# Patient Record
Sex: Male | Born: 2000 | Hispanic: No | Marital: Single | Smoking: Current every day smoker
Health system: Southern US, Community
[De-identification: ages and names within clinical notes are randomized; demographics above are authoritative.]

## PROBLEM LIST (undated history)

## (undated) DIAGNOSIS — F919 Conduct disorder, unspecified: Secondary | ICD-10-CM

## (undated) DIAGNOSIS — F1210 Cannabis abuse, uncomplicated: Secondary | ICD-10-CM

## (undated) DIAGNOSIS — Z639 Problem related to primary support group, unspecified: Secondary | ICD-10-CM

## (undated) DIAGNOSIS — F419 Anxiety disorder, unspecified: Secondary | ICD-10-CM

## (undated) DIAGNOSIS — E663 Overweight: Secondary | ICD-10-CM

## (undated) DIAGNOSIS — J309 Allergic rhinitis, unspecified: Secondary | ICD-10-CM

## (undated) DIAGNOSIS — H1013 Acute atopic conjunctivitis, bilateral: Secondary | ICD-10-CM

## (undated) HISTORY — DX: Overweight: E66.3

## (undated) HISTORY — DX: Conduct disorder, unspecified: F91.9

## (undated) HISTORY — DX: Anxiety disorder, unspecified: F41.9

## (undated) HISTORY — DX: Cannabis abuse, uncomplicated: F12.10

## (undated) HISTORY — DX: Acute atopic conjunctivitis, bilateral: H10.13

## (undated) HISTORY — DX: Problem related to primary support group, unspecified: Z63.9

## (undated) HISTORY — DX: Allergic rhinitis, unspecified: J30.9

---
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
## 2020-03-23 ENCOUNTER — Encounter (HOSPITAL_COMMUNITY): Payer: Self-pay | Admitting: Emergency Medicine

## 2020-03-23 ENCOUNTER — Emergency Department (HOSPITAL_COMMUNITY)
Admission: EM | Admit: 2020-03-23 | Discharge: 2020-03-25 | Disposition: A | Discharge status: Other Acute Inpatient Hospital | Payer: Medicaid Other | Attending: Emergency Medicine | Admitting: Emergency Medicine

## 2020-03-23 ENCOUNTER — Other Ambulatory Visit: Payer: Self-pay

## 2020-03-23 DIAGNOSIS — R45851 Suicidal ideations: Secondary | ICD-10-CM | POA: Diagnosis not present

## 2020-03-23 DIAGNOSIS — F17290 Nicotine dependence, other tobacco product, uncomplicated: Secondary | ICD-10-CM | POA: Diagnosis not present

## 2020-03-23 DIAGNOSIS — F329 Major depressive disorder, single episode, unspecified: Secondary | ICD-10-CM | POA: Diagnosis present

## 2020-03-23 DIAGNOSIS — F12259 Cannabis dependence with psychotic disorder, unspecified: Secondary | ICD-10-CM | POA: Insufficient documentation

## 2020-03-23 DIAGNOSIS — F323 Major depressive disorder, single episode, severe with psychotic features: Secondary | ICD-10-CM | POA: Insufficient documentation

## 2020-03-23 DIAGNOSIS — Z20822 Contact with and (suspected) exposure to covid-19: Secondary | ICD-10-CM | POA: Insufficient documentation

## 2020-03-23 LAB — COMPREHENSIVE METABOLIC PANEL
ALT: 18 U/L (ref 0–44)
AST: 19 U/L (ref 15–41)
Albumin: 4.9 g/dL (ref 3.5–5.0)
Alkaline Phosphatase: 67 U/L (ref 38–126)
Anion gap: 13 (ref 5–15)
BUN: 12 mg/dL (ref 6–20)
CO2: 21 mmol/L — ABNORMAL LOW (ref 22–32)
Calcium: 9.6 mg/dL (ref 8.9–10.3)
Chloride: 106 mmol/L (ref 98–111)
Creatinine, Ser: 0.89 mg/dL (ref 0.61–1.24)
GFR calc Af Amer: >60 mL/min (ref >60)
GFR calc non Af Amer: >60 mL/min (ref >60)
Glucose, Bld: 111 mg/dL — ABNORMAL HIGH (ref 70–99)
Potassium: 3.4 mmol/L — ABNORMAL LOW (ref 3.5–5.1)
Sodium: 140 mmol/L (ref 135–145)
Total Bilirubin: 1.5 mg/dL — ABNORMAL HIGH (ref 0.3–1.2)
Total Protein: 8.3 g/dL — ABNORMAL HIGH (ref 6.5–8.1)

## 2020-03-23 LAB — ETHANOL: Alcohol, Ethyl (B): <10 mg/dL (ref <10)

## 2020-03-23 LAB — CBC
HCT: 48.9 % (ref 39.0–52.0)
Hemoglobin: 16.4 g/dL (ref 13.0–17.0)
MCH: 29.5 pg (ref 26.0–34.0)
MCHC: 33.5 g/dL (ref 30.0–36.0)
MCV: 88.1 fL (ref 80.0–100.0)
Platelets: 273 10*3/uL (ref 150–400)
RBC: 5.55 MIL/uL (ref 4.22–5.81)
RDW: 12.5 % (ref 11.5–15.5)
WBC: 8.8 10*3/uL (ref 4.0–10.5)
nRBC: 0 % (ref 0.0–0.2)

## 2020-03-23 LAB — RAPID URINE DRUG SCREEN, HOSP PERFORMED
Amphetamines: NOT DETECTED
Barbiturates: NOT DETECTED
Benzodiazepines: NOT DETECTED
Cocaine: NOT DETECTED
Opiates: NOT DETECTED
Tetrahydrocannabinol: POSITIVE — AB

## 2020-03-23 LAB — SARS CORONAVIRUS 2 BY RT PCR (HOSPITAL ORDER, PERFORMED IN ~~LOC~~ HOSPITAL LAB): SARS Coronavirus 2: NEGATIVE

## 2020-03-23 LAB — ACETAMINOPHEN LEVEL: Acetaminophen (Tylenol), Serum: <10 ug/mL — ABNORMAL LOW (ref 10–30)

## 2020-03-23 LAB — SALICYLATE LEVEL: Salicylate Lvl: <7 mg/dL — ABNORMAL LOW (ref 7.0–30.0)

## 2020-03-23 MED ORDER — ACETAMINOPHEN 325 MG PO TABS: 650.0000 mg | ORAL_TABLET | ORAL | Status: DC | PRN

## 2020-03-23 MED ORDER — ZOLPIDEM TARTRATE 5 MG PO TABS
5.0000 mg | ORAL_TABLET | Freq: Every evening | ORAL | Status: DC | PRN
  Administered 2020-03-24 (×2): 5 mg via ORAL
  Filled 2020-03-23 (×2): qty 1

## 2020-03-23 NOTE — ED Provider Notes (Signed)
[facility] EMERGENCY DEPARTMENT Provider Note   CSN: [id number] Arrival date & time: 03/23/20  1833     History Chief Complaint  Patient presents with  . Suicidal    Dwayne Gallagher is a 19 y.o. male.  Patient is a 19 year old male who has a history of anxiety and conduct disorder.  He came in with his dad.  He states that he has had worsening depression in the recent past because he says his mom has been cheating on his dad.  This is causing him a lot of stress and he is having thoughts that life is not worth living.  He reported to one of our techs that he was having thoughts of slitting his wrist with a knife.  He denies any ingestions.  He smokes marijuana but denies any other drug use.  He denies any alcohol use.  He does have a prior history of suicide attempts.  He denies any physical complaints or recent illnesses other than he has a little spot of a rash on his left arm.  He says is not itchy.        Past Medical History:  Diagnosis Date  . Allergic conjunctivitis of both eyes and rhinitis 05/09/2014  . Anxiety   . Conduct disorder 09/29/2014  . Family problems 05/09/2014  . 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 Overweight     Patient Active Problem List   Diagnosis Date Noted  . Vitamin D insufficiency 08/30/2016  . Acne vulgaris 08/30/2016  . Conduct disorder 09/29/2014  . Marijuana abuse 09/29/2014    History reviewed. No pertinent surgical history.     Family History  Problem Relation Age of Onset  . Asthma Brother   . Osteoporosis Maternal Grandmother   . Hyperlipidemia Maternal Grandmother     Social History   Tobacco Use  . Smoking status: Current Every Day Smoker    Types: E-cigarettes  . Smokeless tobacco: Never Used  Substance Use Topics  . Alcohol use: Yes  . Drug use: Yes    Types: Marijuana    Home Medications Prior to Admission medications   Medication Sig Start Date End Date Taking? Authorizing Provider    adapalene (DIFFERIN) 0.1 % cream Apply topically at bedtime. 09/23/19   Ettefagh, Aron Baba, MD  clindamycin-benzoyl peroxide [facility]) gel Apply topically daily. 09/23/19   Ettefagh, Aron Baba, MD    Allergies    Patient has no known allergies.  Review of Systems   Review of Systems  Constitutional: Negative for chills, diaphoresis, fatigue and fever.  HENT: Negative for congestion, rhinorrhea and sneezing.   Eyes: Negative.   Respiratory: Negative for cough, chest tightness and shortness of breath.   Cardiovascular: Negative for chest pain and leg swelling.  Gastrointestinal: Negative for abdominal pain, blood in stool, diarrhea, nausea and vomiting.  Genitourinary: Negative for difficulty urinating, flank pain, frequency and hematuria.  Musculoskeletal: Negative for arthralgias and back pain.  Skin: Positive for rash.  Neurological: Negative for dizziness, speech difficulty, weakness, numbness and headaches.  Psychiatric/Behavioral: Positive for suicidal ideas.    Physical Exam Updated Vital Signs BP (!) 142/74 (BP Location: Right Arm)   Pulse 60   Temp 99.1 F (37.3 C) (Oral)   Resp 16   Ht 6\' 1"  (1.854 m)   Wt 70 kg   SpO2 100%   BMI 20.36 kg/m   Physical Exam Constitutional:      Appearance: He is well-developed.  HENT:     Head: Normocephalic and  atraumatic.  Eyes:     Pupils: Pupils are equal, round, and reactive to light.  Cardiovascular:     Rate and Rhythm: Normal rate and regular rhythm.     Heart sounds: Normal heart sounds.  Pulmonary:     Effort: Pulmonary effort is normal. No respiratory distress.     Breath sounds: Normal breath sounds. No wheezing or rales.  Chest:     Chest wall: No tenderness.  Abdominal:     General: Bowel sounds are normal.     Palpations: Abdomen is soft.     Tenderness: There is no abdominal tenderness. There is no guarding or rebound.  Musculoskeletal:        General: Normal range of motion.     Cervical back:  Normal range of motion and neck supple.  Lymphadenopathy:     Cervical: No cervical adenopathy.  Skin:    General: Skin is warm and dry.     Findings: No rash.     Comments: Patient has a small slightly raised erythematous rash in the flexor crease of his left elbow.  There is no open wounds.  No vesicles.  No petechiae or purpura.  Neurological:     Mental Status: He is alert and oriented to person, place, and time.     ED Results / Procedures / Treatments   Labs (all labs ordered are listed, but only abnormal results are displayed) Labs Reviewed  COMPREHENSIVE METABOLIC PANEL - Abnormal; Notable for the following components:      Result Value   Potassium 3.4 (*)    CO2 21 (*)    Glucose, Bld 111 (*)    Total Protein 8.3 (*)    Total Bilirubin 1.5 (*)    All other components within normal limits  SALICYLATE LEVEL - Abnormal; Notable for the following components:   Salicylate Lvl <7.0 (*)    All other components within normal limits  ACETAMINOPHEN LEVEL - Abnormal; Notable for the following components:   Acetaminophen (Tylenol), Serum <10 (*)    All other components within normal limits  RAPID URINE DRUG SCREEN, HOSP PERFORMED - Abnormal; Notable for the following components:   Tetrahydrocannabinol POSITIVE (*)    All other components within normal limits  ETHANOL  CBC    EKG EKG Interpretation  Date/Time:  Thursday Mar 23 2020 21:37:58 EDT Ventricular Rate:  60 PR Interval:    QRS Duration: 120 QT Interval:  402 QTC Calculation: 402 R Axis:   -90 Text Interpretation: Sinus rhythm Short PR interval Left anterior fascicular block ST elev, probable normal early repol pattern Baseline wander in lead(s) V3 since last tracing no significant change Confirmed by Rolan Bucco [phone]) on 03/23/2020 9:46:18 PM   Radiology No results found.  Procedures Procedures (including critical care time)  Medications Ordered in ED Medications - No data to display  ED Course  I  have reviewed the triage vital signs and the nursing notes.  Pertinent labs & imaging results that were available during my care of the patient were reviewed by me and considered in my medical decision making (see chart for details).    MDM Rules/Calculators/A&P                      Patient presents with suicidal ideations.  He initially had attempted to leave and I initiated IVC papers.  He is medically cleared and awaiting TTS evaluation. Final Clinical Impression(s) / ED Diagnoses Final diagnoses:  Suicidal ideation  Rx / DC Orders ED Discharge Orders    None       Malvin Johns, MD 03/23/20 2146

## 2020-03-23 NOTE — ED Notes (Signed)
Dr. Fredderick Phenix ( EDP) notified that patient attempted to leave the ER , EDP will speak with patient at triage .

## 2020-03-23 NOTE — ED Triage Notes (Signed)
Patient reports suicidal ideation - plans to cut his wrist with visual hallucinations , depression and anxiety .

## 2020-03-23 NOTE — ED Provider Notes (Signed)
MSE was initiated and I personally evaluated the patient and placed orders (if any) at  9:07 PM on Mar 23, 2020.  The patient appears stable so that the remainder of the MSE may be completed by another provider.  Patient has a history of anxiety and has had worsening depression.  Is having thoughts of wanting to kill himself.  Was trying to leave the ED.  I talk with him and he is calmer now.  IVC papers were filled out.  He is waiting for an ED bed.   Rolan Bucco, MD 03/23/20 2108

## 2020-03-24 NOTE — ED Notes (Signed)
Pt to purple zone with [employer] and [employer]. Pt trying to run out the doors. Mother walked out to the lobby. Mothers name- Danie Chandler [phone]. Pts iphone given to mother by security to take home. No belongings here. Pt IVC'd. Pt in purple scrubs. Does not want his mother to leave.

## 2020-03-24 NOTE — Progress Notes (Signed)
Pt meets inpatient criteria. Referral information has been sent to the following hospitals for review:  [facility] Details [facility] Details [facility] Details [facility] Details [facility] Details [facility] Details  [facility] Details  [facility] Details [facility] Details [facility]  Disposition will continue to follow.   Wells Guiles, LCSW, LCAS Disposition CSW [facility] BHH/TTS [phone]

## 2020-03-24 NOTE — ED Notes (Signed)
Patient denies pain and is resting comfortably.  

## 2020-03-24 NOTE — ED Notes (Signed)
Dinner tray ordered.

## 2020-03-24 NOTE — ED Notes (Signed)
Patient asked to call his mother to update her on his status; pt denies any needs at this time and is calm and coroporative with staff-Monique,RN

## 2020-03-24 NOTE — ED Notes (Signed)
Unknown staff member seen by sitter escorting patient's father to room while RN dealing with another patient; RN asked for parent to leave and was explained to that there are no visitors allowed in this unit due to Covid; He was also made aware that per off going RN patient did not want to speak with parents at this time; Father was advised of HIPPA laws and the reasoning he nor his wife has not received update is due to patient's request. Father voiced understanding and was escorted out of unit by [employer]-Monique,RN

## 2020-03-24 NOTE — ED Notes (Signed)
Lunch ordered 

## 2020-03-24 NOTE — ED Notes (Signed)
Pt talking on phone to mom.

## 2020-03-24 NOTE — ED Notes (Signed)
Patient had an emotional outburst saying "It's all making sense" pt spoke of getting revenge; Pt is tearful at the [facility]

## 2020-03-24 NOTE — ED Notes (Signed)
Pt mother calling requesting an update on pt. This nurse asked pt if he wanted nursing staff to talk to his mother. Pt states he does not want this nurse talking to his mother. This nurse will not communicate with mother per pt request.

## 2020-03-24 NOTE — ED Notes (Signed)
Pt requests something to help him sleep. Pt states that we are going to talk him away and "do something with him". Assured pt that he is safe and that we are not going to "do anything with him". Pt thinks that he is being punished for doing something wrong by being here. Assured pt that he has done nothing wrong and that we are here to help him. Told pt that he needs to tell the counselors the truth if he is hearing voices so he can get help. States "I just over think things when I smoke and don't know what is real". "I think you guys are just going to take me away and do something with me". "I want to see my mom". "just tell me the truth". "When can I leave?". "Can I have a hug?" Pt tearful at times. States that "this place is very strange. This RN stated that I understanded how he felt about it being strange, but we are only going to try to help him.

## 2020-03-24 NOTE — BHH Counselor (Signed)
Patient accepted to [facility] (main campus) for admission 03/25/2020 (anytime after 8am). The accepting provider is Dr. Estill Cotta.  Nursing report [phone].

## 2020-03-24 NOTE — ED Provider Notes (Signed)
10:39 AM Pt holding in Rm 50. Currently IVC for SI. Awaiting TTS eval. Labs reviewed. No acute medical needs identified.   BP (P) 134/68 (BP Location: Right Arm)   Pulse (P) 76   Temp (P) 98.5 F (36.9 C) (Oral)   Resp (P) 18   Ht 6\' 1"  (1.854 m)   Wt 70 kg   SpO2 (P) 100%   BMI 20.36 kg/m     , PA-C 03/24/20 1039    Tegeler, 03/26/20, MD 03/24/20 [phone]

## 2020-03-24 NOTE — BH Assessment (Addendum)
Assessment Note  Dwayne Gallagher is an 19 y.o. male presenting voluntarily to [facility] ED with a chief complaint of depression and suicidal ideation. Patient has since been placed under IVC by EDP after attempting to elope. Patient appears psychotic- he is staring into corners of the room, thought blocking, and struggles to give appropriate answers to questions at times. Patient reports marital problems between his parents are a trigger for his stress. To cope he recently began using [facility] which has caused him to "over-think." Patient currently denies SI/HI/AVH. Patient reports THC use but is unable to provide specifics about his use. He denies using any other substances. Patient denies any trauma history or current charges. Patient gives verbal consent for TTS to contact his mother, [name].  Collateral information obtained from [name] [phone]: Patient recently began to express to her suicidal ideation. She states issues in her marriage with his father are a trigger for him. On 5/12 she reports she found patient attempted to cut his arms and he states he was "trying to cut a vein." She also states patient is paranoid that someone is going to kill him.   Patient is alert and oriented x 4. He is dressed in scrubs, laying in bed. His speech is slow, eye contact is fair, and he appears to be thought blocking. His mood is anxious and his affect is congruent. He has limited insight, judgement, and impulse control. Patient appears to be responding to internal stimuli and appears delusional.  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 Allergic conjunctivitis of both eyes and rhinitis 05/09/2014  . Anxiety   . Conduct disorder 09/29/2014  . Family problems 05/09/2014  . 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 Overweight     History  reviewed. No pertinent surgical history.  Family History:  Family History  Problem Relation Age of Onset  . Asthma Brother   . Osteoporosis Maternal Grandmother   . Hyperlipidemia Maternal Grandmother     Social History:  reports that he has been smoking e-cigarettes. He has never used smokeless tobacco. He reports current alcohol use. He reports current drug use. Drug: Marijuana.  Additional Social History:  Alcohol / Drug Use Pain Medications: see MAR Prescriptions: see MAR Over the Counter: see MAR History of alcohol / drug use?: No history of alcohol / drug abuse  CIWA: CIWA-Ar BP: (!) 142/75 Pulse Rate: 98 COWS:    Allergies: No Known Allergies  Home Medications: (Not in a hospital admission)   OB/GYN Status:  No LMP for male patient.  General Assessment Data Location of Assessment: [facility] ED TTS Assessment: In system Is this a Tele or Face-to-Face Assessment?: Face-to-Face Is this an Initial Assessment or a Re-assessment for this encounter?: Initial Assessment Patient Accompanied by:: N/A Language Other than English: No Living Arrangements: (private residence) What gender do you identify as?: Male Marital status: Single Pregnancy Status: No Living Arrangements: Parent, Other relatives Can pt return to current living arrangement?: Yes Admission Status: Involuntary Petitioner: ED Attending Is patient capable of signing voluntary admission?: No Referral Source: Self/Family/Friend Insurance type: Medicaid     Crisis Care Plan Living Arrangements: Parent, Other relatives Legal Guardian: (self) Name of Psychiatrist: none Name of Therapist: none  Education Status Is patient currently in school?: No Is the patient employed, unemployed or receiving disability?: Employed  Risk to self with  the past 6 months Suicidal Ideation: Yes-Currently Present Has patient been a risk to self within the past 6 months prior to admission? : Yes Suicidal Intent: No-Not  Currently/Within Last 6 Months Has patient had any suicidal intent within the past 6 months prior to admission? : Yes Is patient at risk for suicide?: Yes Suicidal Plan?: No-Not Currently/Within Last 6 Months Has patient had any suicidal plan within the past 6 months prior to admission? : Yes Access to Means: Yes Specify Access to Suicidal Means: access to knives What has been your use of drugs/alcohol within the last 12 months?: THC use Previous Attempts/Gestures: No How many times?: 0 Other Self Harm Risks: denies Triggers for Past Attempts: None known Intentional Self Injurious Behavior: Cutting Comment - Self Injurious Behavior: mother caught him attempting to cut veins on 5/12 Family Suicide History: No Recent stressful life event(s): Divorce Persecutory voices/beliefs?: No Depression: Yes Depression Symptoms: Despondent, Insomnia, Tearfulness, Isolating, Fatigue, Guilt, Loss of interest in usual pleasures, Feeling worthless/self pity, Feeling angry/irritable Substance abuse history and/or treatment for substance abuse?: No Suicide prevention information given to non-admitted patients: Not applicable  Risk to Others within the past 6 months Homicidal Ideation: No Does patient have any lifetime risk of violence toward others beyond the six months prior to admission? : No Thoughts of Harm to Others: No Current Homicidal Intent: No Current Homicidal Plan: No Access to Homicidal Means: No Identified Victim: NA History of harm to others?: No Assessment of Violence: None Noted Violent Behavior Description: NA Does patient have access to weapons?: No Criminal Charges Pending?: No Does patient have a court date: No Is patient on probation?: No  Psychosis Hallucinations: None noted Delusions: Persecutory  Mental Status Report Appearance/Hygiene: In scrubs Eye Contact: Poor Motor Activity: Freedom of movement Speech: Slow Level of Consciousness: Quiet/awake Mood:  Anxious Affect: Anxious Anxiety Level: Moderate Thought Processes: Circumstantial, Thought Blocking Judgement: Impaired Orientation: Person, Place, Time, Situation Obsessive Compulsive Thoughts/Behaviors: None  Cognitive Functioning Concentration: Fair Memory: Recent Intact, Remote Intact Is patient IDD: No Insight: Fair Impulse Control: Fair Appetite: Good Have you had any weight changes? : No Change Sleep: No Change Total Hours of Sleep: (7) Vegetative Symptoms: None  ADLScreening [facility] Assessment Services) Patient's cognitive ability adequate to safely complete daily activities?: Yes Patient able to express need for assistance with ADLs?: Yes Independently performs ADLs?: Yes (appropriate for developmental age)  Prior Inpatient Therapy Prior Inpatient Therapy: No  Prior Outpatient Therapy Prior Outpatient Therapy: No Does patient have an ACCT team?: No Does patient have Intensive In-House Services?  : No Does patient have [facility] services? : No Does patient have P4CC services?: No  ADL Screening (condition at time of admission) Patient's cognitive ability adequate to safely complete daily activities?: Yes Is the patient deaf or have difficulty hearing?: No Does the patient have difficulty seeing, even when wearing glasses/contacts?: No Does the patient have difficulty concentrating, remembering, or making decisions?: No Patient able to express need for assistance with ADLs?: Yes Does the patient have difficulty dressing or bathing?: No Independently performs ADLs?: Yes (appropriate for developmental age) Does the patient have difficulty walking or climbing stairs?: No Weakness of Legs: None Weakness of Arms/Hands: None  Home Assistive Devices/Equipment Home Assistive Devices/Equipment: None  Therapy Consults (therapy consults require a physician order) PT Evaluation Needed: No OT Evalulation Needed: No SLP Evaluation Needed: No Abuse/Neglect Assessment  (Assessment to be complete while patient is alone) Abuse/Neglect Assessment Can Be Completed: Yes Physical Abuse: Denies Verbal Abuse:  Denies Sexual Abuse: Denies Exploitation of patient/patient's resources: Denies Self-Neglect: Denies Values / Beliefs Cultural Requests During Hospitalization: None Spiritual Requests During Hospitalization: None Consults Spiritual Care Consult Needed: No Transition of Care Team Consult Needed: No Advance Directives (For Healthcare) Does Patient Have a Medical Advance Directive?: No Would patient like information on creating a medical advance directive?: No - Patient declined          Disposition: Malachy Chamber, PMHNP recommends in patient treatment. TTS to seek placement. Disposition Initial Assessment Completed for this Encounter: Yes  On Site Evaluation by:   Reviewed with Physician:    Celedonio Miyamoto 03/24/2020 3:53 PM

## 2020-03-24 NOTE — BHH Counselor (Signed)
Disposition: Dwayne Gallagher, PMHNP recommends in patient treatment. TTS to seek placement.    

## 2020-03-24 NOTE — ED Notes (Signed)
Psychiatry team at bedside

## 2020-03-24 NOTE — ED Notes (Signed)
Lunch Tray Ordered @ 1051. 

## 2020-03-24 NOTE — ED Notes (Signed)
IVC/needs TTS  bfast ordered

## 2020-03-25 NOTE — ED Notes (Signed)
Pt's mother called Secretary requesting update - Requested for mother to call back in 30 minutes as pt is sleeping and he had requested no information to be given. Per Secretary, mother attempted to insist on speaking w/RN - stating she was told she would be given an update this AM.

## 2020-03-25 NOTE — ED Provider Notes (Signed)
eEmergency Medicine Observation Re-evaluation Note  Dwayne Gallagher is a 19 y.o. male, seen on rounds today.  Pt initially presented to the ED for complaints of Suicidal Currently, the patient is sleeping, arousable to voice.  Physical Exam  BP 131/71 (BP Location: Left Arm)   Pulse 85   Temp 98 F (36.7 C) (Oral)   Resp 16   Ht 6\' 1"  (1.854 m)   Wt 70 kg   SpO2 97%   BMI 20.36 kg/m  Physical Exam Vitals and nursing note reviewed.  Constitutional:      General: He is not in acute distress.    Appearance: He is well-developed. He is not diaphoretic.  HENT:     Head: Atraumatic.  Eyes:     Pupils: Pupils are equal, round, and reactive to light.  Cardiovascular:     Rate and Rhythm: Normal rate and regular rhythm.  Pulmonary:     Effort: Pulmonary effort is normal. No respiratory distress.  Abdominal:     General: There is no distension.     Palpations: Abdomen is soft.  Musculoskeletal:        General: Normal range of motion.     Cervical back: Normal range of motion and neck supple.  Skin:    General: Skin is warm and dry.  Neurological:     Mental Status: He is alert.     ED Course / MDM  EKG:EKG Interpretation  Date/Time:  Thursday Mar 23 2020 21:37:58 EDT Ventricular Rate:  60 PR Interval:    QRS Duration: 120 QT Interval:  402 QTC Calculation: 402 R Axis:   -90 Text Interpretation: Sinus rhythm Short PR interval Left anterior fascicular block ST elev, probable normal early repol pattern Baseline wander in lead(s) V3 since last tracing no significant change Confirmed by 09-03-1993 [phone]) on 03/23/2020 9:46:18 PM    I have reviewed the labs performed to date as well as medications administered while in observation.  Recent changes in the last 24 hours include none Plan  Current plan is for inpatient treatment.  Patient has been accepted to [facility] main campus.  Plan for transfer today after 8 AM.  Accepting provider is Dr. [facility] Patient is under full IVC at this time.  Patient hemodynamically stable.  Awaiting transport to [facility] for inpatient treatment.  The patient has been placed in psychiatric observation due to the need to provide a safe environment for the patient while obtaining psychiatric consultation and evaluation, as well as ongoing medical and medication management to treat the patient's condition.  The patient has been placed under full IVC at this time.   Jassmine Vandruff A, PA-C 03/25/20 03/27/20    Little, [id number], MD 03/25/20 1322

## 2020-03-25 NOTE — ED Notes (Signed)
Breakfast Ordered 

## 2020-03-25 NOTE — ED Notes (Signed)
Left message for deputy notifying of need for transport to [facility].

## 2020-03-25 NOTE — ED Notes (Addendum)
Pt aware of tx plan - Accepted to [facility] - Pt on phone advising his mother. Deputy called and advised he is en route. No belongings in ED for pt - as per documentation - his mother took them.

## 2020-03-29 ENCOUNTER — Encounter: Payer: Medicaid Other | Admitting: Licensed Clinical Social Worker

## 2020-04-18 ENCOUNTER — Ambulatory Visit (INDEPENDENT_AMBULATORY_CARE_PROVIDER_SITE_OTHER): Payer: Medicaid Other | Admitting: Pediatrics

## 2020-04-18 ENCOUNTER — Encounter: Payer: Self-pay | Admitting: Pediatrics

## 2020-04-18 ENCOUNTER — Other Ambulatory Visit: Payer: Self-pay

## 2020-04-18 VITALS — BP 98/58 | HR 100 | Wt 161.6 lb

## 2020-04-18 DIAGNOSIS — Z09 Encounter for follow-up examination after completed treatment for conditions other than malignant neoplasm: Secondary | ICD-10-CM

## 2020-04-18 NOTE — Patient Instructions (Signed)
It was a pleasure caring for Dwayne Gallagher today.  Please call Dwayne Gallagher if he does not have a psychiatrist helping with his medications. His psychiatrist should be monitoring his blood labs routinely.

## 2020-04-18 NOTE — Progress Notes (Signed)
History was provided by the patient and mother.  Dwayne Gallagher is a 19 y.o. male who is here for hospital follow up.     HPI: - Discharged last Tuesday from inpatient [facility] after ~ 3 weeks - Discharged with multiple medications:    hydroxizine, lithium, fluphenazine, benztropine, olanazapine - Community behavioral health appointment in two days - Has a therapist who he will see today - Per patient, since discharge has been doing well and feels good. Is compliant with medications - Per Mom's report, she is very concerned. Has demonstrated aggressive behavior and states patient does not want to take medications.   Physical Exam:  BP (!) 98/58 (BP Location: Right Arm, Patient Position: Sitting, Cuff Size: Normal)   Pulse 100   Wt 161 lb 9.6 oz (73.3 kg)   SpO2 97%   BMI 21.32 kg/m   Blood pressure percentiles are not available for patients who are 18 years or older.    General:   alert, cooperative and appears stated age     Skin:   normal, acne on face  Oral cavity:   lips, mucosa, and tongue normal; teeth and gums normal  Eyes:   sclerae white, pupils equal and reactive  Ears:   pearly grey, non bulgingTMs bilaterally  Lungs:  clear to auscultation bilaterally  Heart:   regular rate and rhythm, S1, S2 normal, no murmur, click, rub or gallop   Abdomen:  soft, non-tender; bowel sounds normal; no masses,  no organomegaly  GU:  not examined  Extremities:   extremities normal, atraumatic, no cyanosis or edema  Neuro:  normal without focal findings, mental status, speech normal, alert and oriented x3, PERLA and reflexes normal and symmetric    Assessment/Plan: 19 yo Male here for hospital follow up after IVC'ds to [facility] after expressing SI. Patient was admitted for approximately 3 weeks and discharged on multiple antipsychotic and mood stabilizing medications. Since discharge, Mom states patient has exhibited concerning behavior and does not want to take his medications. On my  examination patient lacks some insight into his diagnoses and states he does not "need the medicine". Per Mom's report, he is overall compliant. Will defer routine labs and monitoring to psychiatrist. No active SI/HI today. Patient has therapist and will meet with psychiatrist in two days. Explained to family that if there is any difficulty in seeing psychiatrist or additional resources are needed to let our staff know.   Ellin Mayhew, MD  04/18/20

## 2020-04-20 ENCOUNTER — Other Ambulatory Visit: Payer: Self-pay

## 2020-04-20 ENCOUNTER — Encounter (HOSPITAL_COMMUNITY): Payer: Self-pay | Admitting: Psychiatry

## 2020-04-20 ENCOUNTER — Ambulatory Visit (INDEPENDENT_AMBULATORY_CARE_PROVIDER_SITE_OTHER): Payer: Medicaid Other | Admitting: Psychiatry

## 2020-04-20 DIAGNOSIS — F1994 Other psychoactive substance use, unspecified with psychoactive substance-induced mood disorder: Secondary | ICD-10-CM

## 2020-04-20 DIAGNOSIS — F19951 Other psychoactive substance use, unspecified with psychoactive substance-induced psychotic disorder with hallucinations: Secondary | ICD-10-CM | POA: Diagnosis not present

## 2020-04-20 NOTE — Progress Notes (Signed)
Psychiatric Initial Adult Assessment   Patient Identification: Dwayne Gallagher MRN:  [id number] Date of Evaluation:  04/20/2020   Referral Source: [facility]  Chief Complaint:   " I do not want to take the injection, it makes my arms hurt."  Visit Diagnosis:    ICD-10-CM   1. 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 Substance induced mood disorder (HCC)  F19.94     History of Present Illness: This is a 19 year old young male with history of bipolar disorder with psychotic features and substance abuse who was seen with his mother for evaluation and continuity of care.  As per EMR, patient had presented to the emergency room at [facility] on May 13 with his family.  He was evaluated in the emergency room and then transferred to [facility] psychiatry unit for further stabilization. As per the patient he had gone to the hospital because he was having suicidal ideations in the context of break-up with his girlfriend.  He had plans to end his life by either overdosing on different things or by slitting his wrists. His mother informed that she and her husband decided to bring him to the hospital because patient was acting very strange.  He was having hallucinations both visual as well as auditory.  He was also having paranoid delusions of being watched and followed.  He was not like himself and mother was concerned if he had used any substance that was making him react this way. Patient informed that when he went to the inpatient unit and [facility] he was very irritated and angry.  He stated that he recalls punching holes in the walls and being aggressive. He stayed in [facility] from May 16 to June 1.  Discharge summary from [facility] was reviewed and according to that he was given the diagnosis of bipolar disorder with psychotic features.  He did have significant aggressive outburst during his hospital stay and as result  he was placed on several different medications including 2 antipsychotics. According to the discharge summary from [facility], he was discharged on Prolixin 5 mg in the morning, 10 mg at night, Prolixin Decanoate 25 mg IM every 2 weekly, benztropine one-point 5 in the morning, 1 mg at bedtime, hydroxyzine 25 mg every 4 hourly as needed, lithium 450 mg twice a day, olanzapine 10 mg daily and 20 mg at bedtime.  Patient and his mother stated that he has been taking the medications as prescribed and he has been very sleepy throughout the day.  He has returned back to his work and he is unable to stay awake to perform his duties during the day.  He is unable to drive because of excessive sleepiness. He also reported that he and his girlfriend are back together and the relationship is going stronger.  His mom stated that she has met her and likes her.  Patient stated that he has long hx of smoking weed.  Patient reported that he has experimented with crystal meth back in January.  He stated that a few days before he went to the hospital emergency room at [facility] he was feeling very upset sad after his break-up with his girlfriend.  As result he was smoking weed excessively.  He stated that he did not care about anything and just wanted to end his life by getting high repeatedly.  He stated that he believes the weight he smoked was laced with something and  that is why he reacted the way he did.  He stated that now he is able to think clearly now.  He denies any hallucinations or delusions.  He denied any active suicidal or homicidal ideations.  He stated his plan is to continue to work so that he can keep bringing money and eventually marry his girlfriend.  His mother stated that she just wants him to get back to being himself and then worry about his future plans.  Patient and mother denied any past history of depression, mania, mania, psychosis. Pt denied any symptoms suggestive of PTSD.  Past  Psychiatric History:  Bipolar d/o, Substance abuse, one psychiatric hospitalization at [facility] for Suicidal ideations and delirium due to laced cannabis use.  Previous Psychotropic Medications: Yes   Substance Abuse History in the last 12 months:  Yes.    Consequences of Substance Abuse: Medical Consequences:  Aggression,hallucinations, delusions needing in-patient psychiatric hospitalization  Past Medical History:  Past Medical History:  Diagnosis Date   Allergic conjunctivitis of both eyes and rhinitis 05/09/2014   Anxiety    Conduct disorder 09/29/2014   Family problems 05/09/2014   Marijuana abuse 09/29/2014   Overweight    No past surgical history on file.  Family Psychiatric History: denied  Family History:  Family History  Problem Relation Age of Onset   Asthma Brother    Osteoporosis Maternal Grandmother    Hyperlipidemia Maternal Grandmother     Social History:   Social History   Socioeconomic History   Marital status: Single    Spouse name: Not on file   Number of children: Not on file   Years of education: Not on file   Highest education level: Not on file  Occupational History   Not on file  Tobacco Use   Smoking status: Current Every Day Smoker    Types: E-cigarettes   Smokeless tobacco: Never Used  Substance and Sexual Activity   Alcohol use: Yes   Drug use: Yes    Types: Marijuana   Sexual activity: Not on file  Other Topics Concern   Not on file  Social History Narrative   Lives with parents and younger brother.   Grandparents from [place].   Social Determinants of Health   Financial Resource Strain:    Difficulty of Paying Living Expenses:   Food Insecurity:    Worried About Charity fundraiser in the Last Year:    Arboriculturist in the Last Year:   Transportation Needs:    Film/video editor (Medical):    Lack of Transportation (Non-Medical):   Physical Activity:    Days of Exercise per Week:     Minutes of Exercise per Session:   Stress:    Feeling of Stress :   Social Connections:    Frequency of Communication with Friends and Family:    Frequency of Social Gatherings with Friends and Family:    Attends Religious Services:    Active Member of Clubs or Organizations:    Attends Archivist Meetings:    Marital Status:     Additional Social History: Lives with mom, dad, 60 y/o brother. Works for [employer]  Allergies:  No Known Allergies  Metabolic Disorder Labs: Lab Results  Component Value Date   HGBA1C 5.3 09/03/2017   MPG 105 09/03/2017   MPG 108 07/05/2016   No results found for: PROLACTIN Lab Results  Component Value Date   CHOL 126 09/23/2018   TRIG 54 09/23/2018  HDL 45 (L) 09/23/2018   CHOLHDL 2.8 09/23/2018   VLDL 52 (H) 07/05/2016   LDLCALC 68 09/23/2018   LDLCALC 97 09/03/2017   Lab Results  Component Value Date   TSH 1.25 09/23/2018    Therapeutic Level Labs: No results found for: LITHIUM No results found for: CBMZ No results found for: VALPROATE  Current Medications: Current Outpatient Medications  Medication Sig Dispense Refill   adapalene (DIFFERIN) 0.1 % cream Apply topically at bedtime. (Patient not taking: Reported on 03/23/2020) 45 g 11   benztropine (COGENTIN) 0.5 MG tablet Take 0.5 mg by mouth 2 (two) times daily.     clindamycin-benzoyl peroxide (BENZACLIN) gel Apply topically daily. (Patient not taking: Reported on 03/23/2020) 50 g 11   fluPHENAZine (PROLIXIN) 5 MG tablet Take 5 mg by mouth daily.     fluPHENAZine decanoate (PROLIXIN) 25 MG/ML injection Inject into the muscle every 14 (fourteen) days.     HYDROXYZINE PAMOATE PO Take 25 mg by mouth.     lithium carbonate (ESKALITH) 450 MG CR tablet Take by mouth 2 (two) times daily.     OLANZapine (ZYPREXA) 10 MG tablet Take 10 mg by mouth at bedtime.     No current facility-administered medications for this visit.    Musculoskeletal: Strength &  Muscle Tone: within normal limits Gait & Station: normal Patient leans: N/A  Psychiatric Specialty Exam: Review of Systems  There were no vitals taken for this visit.There is no height or weight on file to calculate BMI.  General Appearance: Fairly Groomed  Eye Contact:  Good  Speech:  Clear and Coherent and Normal Rate  Volume:  Normal  Mood:  Euthymic  Affect:  Appropriate and Congruent  Thought Process:  Goal Directed  Orientation:  Full (Time, Place, and Person)  Thought Content:  Logical  Suicidal Thoughts:  No  Homicidal Thoughts:  No  Memory:  Immediate;   Good Recent;   Good  Judgement:  Fair  Insight:  Fair  Psychomotor Activity:  Normal  Concentration:  Concentration: Good and Attention Span: Good  Recall:  Good  Fund of Knowledge:Good  Language: Good  Akathisia:  Negative  Handed:  Right  AIMS (if indicated):  Score:1  Assets:  Communication Skills Desire for Improvement Financial Resources/Insurance Housing Social Support  ADL's:  Intact  Cognition: WNL  Sleep:  Good   Screenings: PHQ2-9     Office Visit from 02/15/2014 in [facility] [place] [facility]  PHQ-2 Total Score 2  PHQ-9 Total Score 8      Assessment and Plan: Based on the fact that patient has no prior history of manic, depressive, psychotic symptoms in the past and that he displayed changes in his behaviors including agitation, hallucinations, delusioons after smoking marijuana that was laced with unknown substances his diagnoses are being revised to substance-induced psychotic disorder and substance-induced mood disorder. Since patient is no longer displaying any psychotic or manic symptoms there is no indication for him to be on multiple antipsychotics.  He does not want to be on long-acting injectable medication.  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 Based on all this the decision to discontinue Prolixin, Prolixin Decanoate, lithium, Cogentin was made.  His dose of olanzapine was reduced to 10 mg at bedtime due to excessive daytime sleepiness with  higher dose of 30 mg. Writer spent extensive amount of time explaining to the patient how using marijuana and other illicit substances is detrimental for his mental and physical health and that the mainstay of his treatment is to stay away from all the substances.  Patient verbalized understanding.  His mother also agreed with the plan.  Extensive amount of time was spent in reviewing the EMR and discharge summary from [facility].  Discontinue Prolixin, Prolixin Decanoate, lithium, Cogentin. Reduce dose of olanzapine to 10 mg at bedtime. Follow-up in 3 weeks.   Nevada Crane, MD 6/10/202110:35 AM

## 2020-05-04 ENCOUNTER — Telehealth: Payer: Medicaid Other | Admitting: Pediatrics

## 2020-05-09 ENCOUNTER — Encounter (HOSPITAL_COMMUNITY): Payer: Medicaid Other | Admitting: Psychiatry

## 2020-05-09 ENCOUNTER — Ambulatory Visit: Payer: Medicaid Other | Admitting: Pediatrics

## 2020-05-23 ENCOUNTER — Ambulatory Visit: Payer: Medicaid Other | Admitting: Pediatrics

## 2020-08-22 ENCOUNTER — Encounter: Payer: Self-pay | Admitting: Pediatrics

## 2020-08-22 ENCOUNTER — Ambulatory Visit (INDEPENDENT_AMBULATORY_CARE_PROVIDER_SITE_OTHER): Payer: Medicaid Other | Admitting: Pediatrics

## 2020-08-22 ENCOUNTER — Other Ambulatory Visit: Payer: Self-pay

## 2020-08-22 VITALS — Wt 177.6 lb

## 2020-08-22 DIAGNOSIS — Z23 Encounter for immunization: Secondary | ICD-10-CM

## 2020-08-22 DIAGNOSIS — L700 Acne vulgaris: Secondary | ICD-10-CM

## 2020-08-22 MED ORDER — TRETINOIN 0.025 % EX CREA: TOPICAL_CREAM | Freq: Every day | CUTANEOUS | 11 refills | Status: DC

## 2020-08-22 MED ORDER — DOXYCYCLINE MONOHYDRATE 100 MG PO TABS: 100.0000 mg | ORAL_TABLET | Freq: Every day | ORAL | 1 refills | Status: DC

## 2020-08-22 NOTE — Progress Notes (Signed)
  Subjective:    Dwayne Gallagher is a 19 y.o. old male here for Follow-up for acne .    HPI He was previously prescribed differin cream at bedtime and benzaclin gel in the morning.  He is now using Cerave and Cetaphil face soap.  Using OTC face moisturizer. He is no longer using the previously prescribed creams.  He does not want to use those prescriptions again because he feels like they didn't help enough.  He is interested in an Rx for isotretinoin.  He is also concerned about his acne scars on his face  Review of Systems  History and Problem List: Dwayne Gallagher has Conduct disorder; Marijuana abuse; Vitamin D insufficiency; Acne vulgaris; Substance-induced psychotic disorder with hallucinations (HCC); and Substance induced mood disorder ([facility]CC) on their problem list.  Dwayne Gallagher  has a past medical history of Allergic conjunctivitis of both eyes and rhinitis (05/09/2014), Anxiety, Conduct disorder (09/29/2014), Family problems (05/09/2014), Marijuana abuse (09/29/2014), and Overweight.  Immunizations needed: Flu and COVID--19     Objective:    Wt 177 lb 9.6 oz (80.6 kg)   BMI 23.43 kg/m  Physical Exam Constitutional:      Appearance: Normal appearance. He is not toxic-appearing.  Skin:    General: Skin is warm.     Comments: Acne scars over the cheeks, comedomes on the face with a few inflammatory pustules on the left side of the face.  Neurological:     Mental Status: He is alert.        Assessment and Plan:   Dwayne Gallagher is a 19 y.o. old male with  1. Acne vulgaris Rx for topical retinoid and oral antibiotic to start until he can be seen by dermatology.  Discussed expected treatment course.  Follow-up in 2 months - unless he can get in to see dermatology before then.   - tretinoin (RETIN-A) 0.025 % cream; Apply topically at bedtime.  Dispense: 45 g; Refill: 11 - doxycycline (ADOXA) 100 MG tablet; Take 1 tablet (100 mg total) by mouth daily.  Dispense: 30 tablet; Refill: 1 - Ambulatory  referral to Dermatology  2. Need for vaccination Vaccine counseling provided. - Flu Vaccine QUAD 36+ mos IM Counseled parent & patient in detail regarding the COVID vaccine. Discussed the risks vs benefits of getting the COVID vaccine. Addressed concerns. Gave scheduling info in case he decides to get vaccinated.   Patient agreed to get the COVID vaccine today-No    Return for acne follow-up in 2 months with Dr. Luna Fuse.  [name], MD

## 2020-08-23 ENCOUNTER — Telehealth: Payer: Self-pay

## 2020-08-23 DIAGNOSIS — L700 Acne vulgaris: Secondary | ICD-10-CM

## 2020-08-23 MED ORDER — DOXYCYCLINE HYCLATE 100 MG PO CAPS: 100.0000 mg | ORAL_CAPSULE | Freq: Every day | ORAL | 1 refills | Status: DC

## 2020-08-23 MED ORDER — RETIN-A 0.025 % EX CREA: TOPICAL_CREAM | Freq: Every day | CUTANEOUS | 11 refills | Status: DC

## 2020-08-23 NOTE — Telephone Encounter (Signed)
Pt states pharmacy will not fill RX because insurance wont pay. Can you send an RX that Medicaid will pay?   doxycycline (ADOXA) 100 MG tablet and tretinoin (RETIN-A) 0.025 % cream

## 2020-08-23 NOTE — Telephone Encounter (Signed)
I sent new Rx for brand name Retin-A and doxycyclin hyclate to the pharmacy on file.

## 2020-08-23 NOTE — Telephone Encounter (Addendum)
Retin-A cream is preferred but needs to be ran as brand name and not generic. Doxycyline (Adoxa) not preferred. Pt has to try 2 tetracycline Derivatives in order to be approved with Medicaid.  Here are preferred meds:  doxycycline hyclate capsule / tablet doxycycline monohydrate 50mg , 100mg  capsule minocycline 50mg , 75mg , 100mg  capsule  Routing to provider to review and advise.

## 2020-10-09 ENCOUNTER — Other Ambulatory Visit: Payer: Self-pay

## 2020-10-09 ENCOUNTER — Emergency Department (HOSPITAL_COMMUNITY)
Admission: EM | Admit: 2020-10-09 | Discharge: 2020-10-11 | Disposition: A | Discharge status: Home / Self Care | Payer: Medicaid Other | Attending: Emergency Medicine | Admitting: Emergency Medicine

## 2020-10-09 DIAGNOSIS — Z20822 Contact with and (suspected) exposure to covid-19: Secondary | ICD-10-CM | POA: Insufficient documentation

## 2020-10-09 DIAGNOSIS — R070 Pain in throat: Secondary | ICD-10-CM | POA: Insufficient documentation

## 2020-10-09 DIAGNOSIS — X58XXXA Exposure to other specified factors, initial encounter: Secondary | ICD-10-CM | POA: Insufficient documentation

## 2020-10-09 DIAGNOSIS — Z79899 Other long term (current) drug therapy: Secondary | ICD-10-CM | POA: Insufficient documentation

## 2020-10-09 DIAGNOSIS — T5492XA Toxic effect of unspecified corrosive substance, intentional self-harm, initial encounter: Secondary | ICD-10-CM | POA: Diagnosis not present

## 2020-10-09 DIAGNOSIS — T50992A Poisoning by other drugs, medicaments and biological substances, intentional self-harm, initial encounter: Secondary | ICD-10-CM | POA: Insufficient documentation

## 2020-10-09 DIAGNOSIS — F17290 Nicotine dependence, other tobacco product, uncomplicated: Secondary | ICD-10-CM | POA: Diagnosis not present

## 2020-10-09 DIAGNOSIS — F251 Schizoaffective disorder, depressive type: Secondary | ICD-10-CM | POA: Insufficient documentation

## 2020-10-09 DIAGNOSIS — F19951 Other psychoactive substance use, unspecified with psychoactive substance-induced psychotic disorder with hallucinations: Secondary | ICD-10-CM | POA: Diagnosis not present

## 2020-10-09 DIAGNOSIS — F22 Delusional disorders: Secondary | ICD-10-CM | POA: Insufficient documentation

## 2020-10-09 DIAGNOSIS — T1491XA Suicide attempt, initial encounter: Secondary | ICD-10-CM | POA: Diagnosis present

## 2020-10-10 ENCOUNTER — Emergency Department (HOSPITAL_COMMUNITY): Payer: Medicaid Other

## 2020-10-10 ENCOUNTER — Telehealth: Payer: Self-pay | Admitting: Pediatrics

## 2020-10-10 ENCOUNTER — Encounter (HOSPITAL_COMMUNITY): Payer: Self-pay | Admitting: Radiology

## 2020-10-10 LAB — CBC WITH DIFFERENTIAL/PLATELET
Abs Immature Granulocytes: 0.04 10*3/uL (ref 0.00–0.07)
Basophils Absolute: 0.1 10*3/uL (ref 0.0–0.1)
Basophils Relative: 0 %
Eosinophils Absolute: 0.1 10*3/uL (ref 0.0–0.5)
Eosinophils Relative: 1 %
HCT: 44 % (ref 39.0–52.0)
Hemoglobin: 14.8 g/dL (ref 13.0–17.0)
Immature Granulocytes: 0 %
Lymphocytes Relative: 16 %
Lymphs Abs: 1.8 10*3/uL (ref 0.7–4.0)
MCH: 30.3 pg (ref 26.0–34.0)
MCHC: 33.6 g/dL (ref 30.0–36.0)
MCV: 90 fL (ref 80.0–100.0)
Monocytes Absolute: 0.8 10*3/uL (ref 0.1–1.0)
Monocytes Relative: 7 %
Neutro Abs: 8.6 10*3/uL — ABNORMAL HIGH (ref 1.7–7.7)
Neutrophils Relative %: 76 %
Platelets: 248 10*3/uL (ref 150–400)
RBC: 4.89 MIL/uL (ref 4.22–5.81)
RDW: 12.3 % (ref 11.5–15.5)
WBC: 11.4 10*3/uL — ABNORMAL HIGH (ref 4.0–10.5)
nRBC: 0 % (ref 0.0–0.2)

## 2020-10-10 LAB — URINALYSIS, ROUTINE W REFLEX MICROSCOPIC
Bilirubin Urine: NEGATIVE
Glucose, UA: NEGATIVE mg/dL
Hgb urine dipstick: NEGATIVE
Ketones, ur: NEGATIVE mg/dL
Leukocytes,Ua: NEGATIVE
Nitrite: NEGATIVE
Protein, ur: NEGATIVE mg/dL
Specific Gravity, Urine: 1.023 (ref 1.005–1.030)
pH: 6 (ref 5.0–8.0)

## 2020-10-10 LAB — ETHANOL: Alcohol, Ethyl (B): <10 mg/dL (ref <10)

## 2020-10-10 LAB — COMPREHENSIVE METABOLIC PANEL
ALT: 14 U/L (ref 0–44)
AST: 18 U/L (ref 15–41)
Albumin: 4.2 g/dL (ref 3.5–5.0)
Alkaline Phosphatase: 73 U/L (ref 38–126)
Anion gap: 9 (ref 5–15)
BUN: 18 mg/dL (ref 6–20)
CO2: 24 mmol/L (ref 22–32)
Calcium: 8.9 mg/dL (ref 8.9–10.3)
Chloride: 105 mmol/L (ref 98–111)
Creatinine, Ser: 0.87 mg/dL (ref 0.61–1.24)
GFR, Estimated: >60 mL/min (ref >60)
Glucose, Bld: 109 mg/dL — ABNORMAL HIGH (ref 70–99)
Potassium: 3.5 mmol/L (ref 3.5–5.1)
Sodium: 138 mmol/L (ref 135–145)
Total Bilirubin: 0.5 mg/dL (ref 0.3–1.2)
Total Protein: 7.3 g/dL (ref 6.5–8.1)

## 2020-10-10 LAB — RAPID URINE DRUG SCREEN, HOSP PERFORMED
Amphetamines: NOT DETECTED
Barbiturates: NOT DETECTED
Benzodiazepines: NOT DETECTED
Cocaine: NOT DETECTED
Opiates: NOT DETECTED
Tetrahydrocannabinol: POSITIVE — AB

## 2020-10-10 LAB — LITHIUM LEVEL: Lithium Lvl: <0.06 mmol/L — ABNORMAL LOW (ref 0.60–1.20)

## 2020-10-10 LAB — RESP PANEL BY RT-PCR (FLU A&B, COVID) ARPGX2
Influenza A by PCR: NEGATIVE
Influenza B by PCR: NEGATIVE
SARS Coronavirus 2 by RT PCR: NEGATIVE

## 2020-10-10 LAB — ACETAMINOPHEN LEVEL: Acetaminophen (Tylenol), Serum: <10 ug/mL — ABNORMAL LOW (ref 10–30)

## 2020-10-10 LAB — SALICYLATE LEVEL: Salicylate Lvl: <7 mg/dL — ABNORMAL LOW (ref 7.0–30.0)

## 2020-10-10 MED ORDER — STERILE WATER FOR INJECTION IJ SOLN
INTRAMUSCULAR | Status: AC
  Filled 2020-10-10: qty 10

## 2020-10-10 MED ORDER — ONDANSETRON HCL 4 MG PO TABS: 4.0000 mg | ORAL_TABLET | Freq: Three times a day (TID) | ORAL | Status: DC | PRN

## 2020-10-10 MED ORDER — ZIPRASIDONE MESYLATE 20 MG IM SOLR
10.0000 mg | Freq: Once | INTRAMUSCULAR | Status: AC
  Administered 2020-10-10: 10 mg via INTRAMUSCULAR
  Filled 2020-10-10: qty 20

## 2020-10-10 MED ORDER — LACTATED RINGERS IV BOLUS
1000.0000 mL | Freq: Once | INTRAVENOUS | Status: AC
  Administered 2020-10-10: 1000 mL via INTRAVENOUS

## 2020-10-10 MED ORDER — ZOLPIDEM TARTRATE 5 MG PO TABS: 5.0000 mg | ORAL_TABLET | Freq: Every evening | ORAL | Status: DC | PRN

## 2020-10-10 MED ORDER — ALUM & MAG HYDROXIDE-SIMETH 200-200-20 MG/5ML PO SUSP: 30.0000 mL | Freq: Four times a day (QID) | ORAL | Status: DC | PRN

## 2020-10-10 MED ORDER — ONDANSETRON HCL 4 MG/2ML IJ SOLN
4.0000 mg | Freq: Once | INTRAMUSCULAR | Status: AC
  Administered 2020-10-10: 4 mg via INTRAVENOUS
  Filled 2020-10-10: qty 2

## 2020-10-10 MED ORDER — LITHIUM CARBONATE ER 300 MG PO TBCR
300.0000 mg | EXTENDED_RELEASE_TABLET | Freq: Two times a day (BID) | ORAL | Status: DC
  Administered 2020-10-10 – 2020-10-11 (×3): 300 mg via ORAL
  Filled 2020-10-10 (×3): qty 1

## 2020-10-10 MED ORDER — NICOTINE 21 MG/24HR TD PT24
21.0000 mg | MEDICATED_PATCH | Freq: Every day | TRANSDERMAL | Status: DC
  Filled 2020-10-10: qty 1

## 2020-10-10 MED ORDER — OLANZAPINE 5 MG PO TBDP
5.0000 mg | ORAL_TABLET | Freq: Every day | ORAL | Status: DC
  Administered 2020-10-10 – 2020-10-11 (×2): 5 mg via ORAL
  Filled 2020-10-10 (×2): qty 1

## 2020-10-10 MED ORDER — ACETAMINOPHEN 325 MG PO TABS: 650.0000 mg | ORAL_TABLET | ORAL | Status: DC | PRN

## 2020-10-10 MED ORDER — LORAZEPAM 2 MG/ML IJ SOLN
1.0000 mg | Freq: Once | INTRAMUSCULAR | Status: AC
  Administered 2020-10-10: 1 mg via INTRAMUSCULAR
  Filled 2020-10-10: qty 1

## 2020-10-10 NOTE — ED Provider Notes (Signed)
[facility]-EMERGENCY DEPT Provider Note   CSN: [id number] Arrival date & time: 10/09/20  2352   History Chief Complaint  Patient presents with  . Poisoning    Dwayne Gallagher is a 19 y.o. male.  The history is provided by the patient.  He has history of conduct disorder, substance induced psychotic disorder and comes in after having ingested bleach at home.  He states that he took 2 gulps.  He is complaining of a sore throat and some mild nausea but has not vomited.  He denies any difficulty breathing or chest pain.  He denies any coingestants.  He does admit to suicidal intent.  He would not tell me how long he has felt suicidal.  He denies hallucinations.  He specifically denies ethanol ingestion.  Past Medical History:  Diagnosis Date  . Allergic conjunctivitis of both eyes and rhinitis 05/09/2014  . Anxiety   . Conduct disorder 09/29/2014  . Family problems 05/09/2014  . 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 Overweight     Patient Active Problem List   Diagnosis Date Noted  . 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 Substance induced mood disorder (HCC) 04/20/2020  . Vitamin D insufficiency 08/30/2016  . Acne vulgaris 08/30/2016  . Conduct disorder 09/29/2014  . Marijuana abuse 09/29/2014    No past surgical history on file.     Family History  Problem Relation Age of Onset  . Asthma Brother   . Osteoporosis Maternal Grandmother   . Hyperlipidemia Maternal Grandmother     Social History   Tobacco Use  . Smoking status: Current Every Day Smoker    Types: E-cigarettes  . Smokeless tobacco: Never Used  Substance Use Topics  . Alcohol use: Yes  . Drug use: Yes    Types: Marijuana    Home Medications Prior to Admission medications   Medication Sig Start Date End Date Taking? Authorizing Provider  benztropine (COGENTIN) 0.5 MG tablet Take 0.5 mg by mouth 2 (two) times daily.    [provider]   doxycycline (VIBRAMYCIN) 100 MG capsule Take 1 capsule (100 mg total) by mouth daily. 08/23/20   Ettefagh, Aron Baba, MD  fluPHENAZine (PROLIXIN) 5 MG tablet Take 5 mg by mouth daily.    [provider]  HYDROXYZINE PAMOATE PO Take 25 mg by mouth.    [provider]  lithium carbonate (ESKALITH) 450 MG CR tablet Take by mouth 2 (two) times daily.    [provider]  OLANZapine (ZYPREXA) 10 MG tablet Take 10 mg by mouth at bedtime.    [provider]  RETIN-A 0.025 % cream Apply topically at bedtime. 08/23/20   Ettefagh, Aron Baba, MD    Allergies    Patient has no known allergies.  Review of Systems   Review of Systems  All other systems reviewed and are negative.   Physical Exam Updated Vital Signs BP (!) 142/85 (BP Location: Right Arm)   Pulse 82   Temp 98.2 F (36.8 C) (Oral)   Resp 16   Ht 5\' 8"  (1.727 m)   Wt 73.5 kg   SpO2 98%   BMI 24.63 kg/m   Physical Exam Vitals and nursing note reviewed.   19 year old male, resting comfortably and in no acute distress. Vital signs are significant for mildly elevated blood pressure. Oxygen saturation is 98%, which is normal. Head is normocephalic and atraumatic. PERRLA, EOMI. Oropharynx is clear - no evidence of any oral burns, no  difficulty with secretions. Neck is nontender and supple without adenopathy or JVD. Back is nontender and there is no CVA tenderness. Lungs are clear without rales, wheezes, or rhonchi. Chest is nontender. Heart has regular rate and rhythm without murmur. Abdomen is soft, flat, nontender without masses or hepatosplenomegaly and peristalsis is hypoactive. Extremities have no cyanosis or edema, full range of motion is present. Skin is warm and dry without rash. Neurologic: Mental status is normal, cranial nerves are intact, there are no motor or sensory deficits.  ED Results / Procedures / Treatments   Labs (all labs ordered are listed, but only abnormal results  are displayed) Labs Reviewed  ACETAMINOPHEN LEVEL - Abnormal; Notable for the following components:      Result Value   Acetaminophen (Tylenol), Serum <10 (*)    All other components within normal limits  COMPREHENSIVE METABOLIC PANEL - Abnormal; Notable for the following components:   Glucose, Bld 109 (*)    All other components within normal limits  SALICYLATE LEVEL - Abnormal; Notable for the following components:   Salicylate Lvl <7.0 (*)    All other components within normal limits  CBC WITH DIFFERENTIAL/PLATELET - Abnormal; Notable for the following components:   WBC 11.4 (*)    Neutro Abs 8.6 (*)    All other components within normal limits  URINALYSIS, ROUTINE W REFLEX MICROSCOPIC - Abnormal; Notable for the following components:   APPearance HAZY (*)    All other components within normal limits  RAPID URINE DRUG SCREEN, HOSP PERFORMED - Abnormal; Notable for the following components:   Tetrahydrocannabinol POSITIVE (*)    All other components within normal limits  LITHIUM LEVEL - Abnormal; Notable for the following components:   Lithium Lvl <0.06 (*)    All other components within normal limits  RESP PANEL BY RT-PCR (FLU A&B, COVID) [id number]  ETHANOL  LITHIUM LEVEL    EKG EKG Interpretation  Date/Time:  Tuesday October 10 2020 00:43:02 EST Ventricular Rate:  73 PR Interval:    QRS Duration: 120 QT Interval:  400 QTC Calculation: 441 R Axis:   -104 Text Interpretation: Sinus rhythm Incomplete RBBB and LAFB ST elev, probable normal early repol pattern When compared with ECG of 03/23/2020, Right bundle branch block is now present Confirmed by Dione Booze ([id number]) on 10/10/2020 12:47:44 AM   Radiology DG Chest Port 1 View  Result Date: 10/10/2020 CLINICAL DATA:  Recent bleach ingestion EXAM: PORTABLE CHEST 1 VIEW COMPARISON:  07/13/2014 FINDINGS: Cardiac shadow is within normal limits. The lungs are clear bilaterally. No acute bony abnormality is seen. IMPRESSION: No  active disease. Electronically Signed   By: Alcide Clever M.D.   On: 10/10/2020 00:47    Procedures Procedures  CRITICAL CARE Performed by: Dione Booze Total critical care time: 50 minutes Critical care time was exclusive of separately billable procedures and treating other patients. Critical care was necessary to treat or prevent imminent or life-threatening deterioration. Critical care was time spent personally by me on the following activities: development of treatment plan with patient and/or surrogate as well as nursing, discussions with consultants, evaluation of patient's response to treatment, examination of patient, obtaining history from patient or surrogate, ordering and performing treatments and interventions, ordering and review of laboratory studies, ordering and review of radiographic studies, pulse oximetry and re-evaluation of patient's condition.  Medications Ordered in ED Medications  acetaminophen (TYLENOL) tablet 650 mg (has no administration in time range)  zolpidem (AMBIEN) tablet 5 mg (has no administration in time  range)  ondansetron (ZOFRAN) tablet 4 mg (has no administration in time range)  alum & mag hydroxide-simeth (MAALOX/MYLANTA) 200-200-20 MG/5ML suspension 30 mL (has no administration in time range)  nicotine (NICODERM CQ - dosed in mg/24 hours) patch 21 mg (has no administration in time range)  lactated ringers bolus 1,000 mL (0 mLs Intravenous Stopped 10/10/20 0132)  ondansetron (ZOFRAN) injection 4 mg (4 mg Intravenous Given 10/10/20 0131)    ED Course  I have reviewed the triage vital signs and the nursing notes.  Pertinent labs & imaging results that were available during my care of the patient were reviewed by me and considered in my medical decision making (see chart for details).  MDM Rules/Calculators/A&P Bleach ingestion with suicidal intent.  Poison control has been consulted and treatment is supportive.  Old records are reviewed, and he does  have a prior ED visit for suicidal ideation.  He will need to be observed in the ED for 6 hours before being considered cleared for psychiatric evaluation.  It is noted that lithium is on his medication list, will check lithium level and also will check repeat level to make sure that he has not had an additional overdose of lithium.  Patient is not willing to stay for psychiatric treatment, so he is placed under involuntary commitment.  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  Chest x-ray is normal.  Labs are unremarkable, drug screen positive for marijuana.  He has been observed in the ED with no respiratory issues, no nausea or vomiting.  Lithium level is not detectable consistent with medication noncompliance.  He is felt to be medically cleared for psychiatric evaluation.  TTS consultation has been requested.  Final Clinical Impression(s) / ED Diagnoses Final diagnoses:  Ingestion of bleach, intentional self-harm, initial encounter [facility])    Rx / DC Orders ED Discharge Orders    None       Dione Booze, MD 10/10/20 [phone]

## 2020-10-10 NOTE — ED Provider Notes (Signed)
Called to bedside for agitation.  Patient attempting to leave, being restrained by security staff. He is agitated, unable to redirect. He temporarily requires restraints, medications for agitation due to patient being a danger to self and others.   On repeat assessment following medications patient is calm, restraints can be removed.   Tilden Fossa, MD 10/10/20 2104

## 2020-10-10 NOTE — Telephone Encounter (Signed)
Mom called and stated that the patient attempted suicide last night. He is in the hospital but they are not giving any information about the patient to mom. She would like a psychiatrist for him please call.

## 2020-10-10 NOTE — ED Triage Notes (Signed)
Pt to ER via EMS from home after he drank an unknown amount of bleach PTA.  Pt vomitting intermittently.  NADN. Pt will not state whether or not he intended to hurt himself.

## 2020-10-10 NOTE — ED Notes (Signed)
Pt given purple scrubs to change into.  Pt instructed that he is to place his personal clothing in a belongings bag that will be collected and placed in a secure cabinet.  Pt agrees with plan.  NADN.

## 2020-10-10 NOTE — ED Notes (Signed)
Spoke with Duwayne Heck at [employer].  Poison control advises that pt be NPO and monitored for blistering, drooling, inability to swallow x 1 hour.  Also advised to give water to swish in mouth prior to NPO status.  Will continue to monitor.

## 2020-10-10 NOTE — BH Assessment (Signed)
BHH Assessment Progress Note  Per Shuvon Rankin, NP this involuntary pt requires psychiatric hospitalization at this time.  Pt presents under IVC initiated by EDP Dione Booze, MD.  At 15:42 this writer submitted pt to Percell Boston, RN via secure chat for consideration for admission to [facility]. Decision is pending as of this writing.  Doylene Canning, [place] Behavioral Health Coordinator [phone]

## 2020-10-10 NOTE — BHH Counselor (Signed)
Disposition: Shuvon Rankin, NP recommends in patient treatment. CSW to seek placement. Attempted to notify [facility] ED RN, however unable to reach her at this time.

## 2020-10-10 NOTE — ED Notes (Signed)
Dwayne Gallagher, mother would like an update on her son, [phone].

## 2020-10-10 NOTE — ED Notes (Signed)
Patients belongings have been removed and placed in the belongings cabinet

## 2020-10-10 NOTE — BH Assessment (Addendum)
Tele Assessment Note   Patient Name: Dwayne Gallagher MRN: [id number] Referring Physician: Preston Fleeting Location of Patient: [facility] Location of Provider: Behavioral Health TTS Department  Susana Gripp is an 19 y.o. male presenting voluntarily to [facility] ED via EMS after ingesting bleach in a suicide attempt. Patient has since been placed under IVC by EDP. Patient admits he was attempting to end his life because "No one believes me. Everyone just thinks I'm crazy." Patient seen by TTS in May 2021 with psychotic symptoms thought to be substance induced. After d/c from [facility] he followed up with Dr. Evelene Croon for med management, however states he is no longer taking it because he does not feel he needs it. Patient appears delusional at this time. He states "All the visions I had before the hospital are coming true. I have enemies and they are trying to make me look crazy." Patient expressed paranoia that his mother might be in harm, his girlfriend is cheating on him, and individuals are posting his "visions" on Instagram. Patient denies current SI/HI/AVH. Patient reports he last heard voice while hospitalized at [facility]. Patient reports using THC about 2 times monthly. He reports his last use was 1 week ago. He denies any other substance use and UDS is positive for THC only. Patient denies any trauma history or criminal charges.  Patient gives verbal consent for TTS to speak with his mother, [name], at [phone], for collateral information if necessary.  Patient is alert and oriented x 4. He is dressed in scrubs, sitting upright in bed. His speech is logical, eye contact is good, and thoughts are circumstantial. His mood is anxious/depressed and his affect is congruent. He has poor insight/judgement. He does not appear to be responding to internal stimuli during assessment. Patient does appear to be delusional.  Diagnosis: F25.1 Schizoaffective disorder, depressed type  Past Medical  History:  Past Medical History:  Diagnosis Date  . Allergic conjunctivitis of both eyes and rhinitis 05/09/2014  . Anxiety   . Conduct disorder 09/29/2014  . Family problems 05/09/2014  . 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 Overweight     No past surgical history on file.  Family History:  Family History  Problem Relation Age of Onset  . Asthma Brother   . Osteoporosis Maternal Grandmother   . Hyperlipidemia Maternal Grandmother     Social History:  reports that he has been smoking e-cigarettes. He has never used smokeless tobacco. He reports current alcohol use. He reports current drug use. Drug: Marijuana.  Additional Social History:  Alcohol / Drug Use Pain Medications: see MAR Prescriptions: see MAR Over the Counter: see MAR History of alcohol / drug use?: Yes Substance #1 Name of Substance 1: THC 1 - Age of First Use: UTA 1 - Amount (size/oz): varies 1 - Frequency: 2 times monthly 1 - Duration: UTA 1 - Last Use / Amount: last week  CIWA: CIWA-Ar BP: 135/79 Pulse Rate: 72 COWS:    Allergies: No Known Allergies  Home Medications: (Not in a hospital admission)   OB/GYN Status:  No LMP for male patient.  General Assessment Data Location of Assessment: [facility] ED TTS Assessment: In system Is this a Tele or Face-to-Face Assessment?: Tele Assessment Is this an Initial Assessment or a Re-assessment for this encounter?: Initial Assessment Patient Accompanied by:: N/A Language Other than English: No Living Arrangements:  (private residence) What gender do you identify as?: Male Date Telepsych consult ordered in [facility]: 10/10/20 Time Telepsych consult ordered in  [facility]: 0530 Marital status: Single Maiden name: Derrell Lolling Pregnancy Status: No Living Arrangements: Parent, Other relatives Can pt return to current living arrangement?: Yes Admission Status: Involuntary Petitioner: ED Attending Is patient capable of signing voluntary admission?: No Referral Source:  Self/Family/Friend Insurance type: Medicaid     Crisis Care Plan Living Arrangements: Parent, Other relatives Legal Guardian:  (sef) Name of Psychiatrist: Dr. Evelene Croon Name of Therapist: none  Education Status Is patient currently in school?: No Is the patient employed, unemployed or receiving disability?: Employed  Risk to self with the past 6 months Suicidal Ideation: No-Not Currently/Within Last 6 Months Has patient been a risk to self within the past 6 months prior to admission? : Yes Suicidal Intent: No-Not Currently/Within Last 6 Months Has patient had any suicidal intent within the past 6 months prior to admission? : Yes Is patient at risk for suicide?: Yes Suicidal Plan?: No-Not Currently/Within Last 6 Months Has patient had any suicidal plan within the past 6 months prior to admission? : Yes Access to Means: Yes Specify Access to Suicidal Means: patient drank bleach What has been your use of drugs/alcohol within the last 12 months?: occasional THC use Previous Attempts/Gestures: No How many times?: 0 Other Self Harm Risks: denies Triggers for Past Attempts: None known Intentional Self Injurious Behavior: None Family Suicide History: No Recent stressful life event(s): Conflict (Comment) (with friends/family) Persecutory voices/beliefs?: Yes Depression: Yes Depression Symptoms: Despondent, Tearfulness, Isolating, Fatigue, Guilt, Loss of interest in usual pleasures, Feeling worthless/self pity Substance abuse history and/or treatment for substance abuse?: No Suicide prevention information given to non-admitted patients: Not applicable  Risk to Others within the past 6 months Homicidal Ideation: No Does patient have any lifetime risk of violence toward others beyond the six months prior to admission? : No Thoughts of Harm to Others: No Current Homicidal Intent: No Current Homicidal Plan: No Access to Homicidal Means: No Identified Victim: denies History of harm to  others?: No Assessment of Violence: None Noted Violent Behavior Description: deies Does patient have access to weapons?: No Criminal Charges Pending?: No Does patient have a court date: No Is patient on probation?: No  Psychosis Hallucinations: Auditory, Visual Delusions: Persecutory, Jealous  Mental Status Report Appearance/Hygiene: In scrubs Eye Contact: Good Motor Activity: Freedom of movement Speech: Logical/coherent Level of Consciousness: Alert Mood: Depressed Affect: Depressed Anxiety Level: Moderate Thought Processes: Circumstantial, Tangential Judgement: Impaired Orientation: Person, Time, Place, Situation Obsessive Compulsive Thoughts/Behaviors: None  Cognitive Functioning Concentration: Normal Memory: Recent Intact, Remote Intact Is patient IDD: No Insight: Poor Impulse Control: Fair Appetite: Poor Have you had any weight changes? : Loss Amount of the weight change? (lbs): 10 lbs Sleep: No Change Total Hours of Sleep: 8 Vegetative Symptoms: None  ADLScreening [facility] Assessment Services) Patient's cognitive ability adequate to safely complete daily activities?: Yes Patient able to express need for assistance with ADLs?: Yes Independently performs ADLs?: Yes (appropriate for developmental age)  Prior Inpatient Therapy Prior Inpatient Therapy: Yes Prior Therapy Dates: 03/2020 Prior Therapy Facilty/Provider(s): [facility] Reason for Treatment: psychosis  Prior Outpatient Therapy Prior Outpatient Therapy: Yes Prior Therapy Dates: 04/2020 Prior Therapy Facilty/Provider(s): [facility]- Dr. Evelene Croon Reason for Treatment: med management Does patient have an ACCT team?: No Does patient have Intensive In-House Services?  : No Does patient have [facility] services? : No Does patient have P4CC services?: No  ADL Screening (condition at time of admission) Patient's cognitive ability adequate to safely complete daily activities?: Yes Is the patient deaf or have difficulty  hearing?: No  Does the patient have difficulty seeing, even when wearing glasses/contacts?: No Does the patient have difficulty concentrating, remembering, or making decisions?: Yes Patient able to express need for assistance with ADLs?: Yes Does the patient have difficulty dressing or bathing?: No Independently performs ADLs?: Yes (appropriate for developmental age) Does the patient have difficulty walking or climbing stairs?: No Weakness of Legs: None Weakness of Arms/Hands: None  Home Assistive Devices/Equipment Home Assistive Devices/Equipment: None  Therapy Consults (therapy consults require a physician order) PT Evaluation Needed: No OT Evalulation Needed: No SLP Evaluation Needed: No Abuse/Neglect Assessment (Assessment to be complete while patient is alone) Abuse/Neglect Assessment Can Be Completed: Yes Physical Abuse: Denies Verbal Abuse: Denies Sexual Abuse: Denies Exploitation of patient/patient's resources: Denies Self-Neglect: Denies Values / Beliefs Cultural Requests During Hospitalization: None Spiritual Requests During Hospitalization: None Consults Spiritual Care Consult Needed: No Transition of Care Team Consult Needed: No Advance Directives (For Healthcare) Does Patient Have a Medical Advance Directive?: No Would patient like information on creating a medical advance directive?: No - Patient declined          Disposition: Shuvon Rankin, NP recommends in patient treatment. CSW to seek placement. Disposition Initial Assessment Completed for this Encounter: Yes  This service was provided via telemedicine using a 2-way, interactive audio and video technology.  Names of all persons participating in this telemedicine service and their role in this encounter. Name: Fatima Sanger Role: patient  Name: Celedonio Miyamoto, LCSW Role: TTS  Name:  Role:   Name:  Role:     Celedonio Miyamoto 10/10/2020 8:36 AM

## 2020-10-10 NOTE — ED Notes (Signed)
Pt began walking up and down the hall, RN attempted to redirect pt. Pt was wanting to leave and call his parents to come and get him.  Pt became verbally abusive and security was called. Pt became physically aggressive with security. MD made aware and restraints and meds given per [facility]. Pt has Development worker, community.

## 2020-10-10 NOTE — ED Notes (Signed)
Pt has one pt bag in the pt cabinet at the nursing station 9-25

## 2020-10-10 NOTE — ED Notes (Signed)
Pt given water and graham crackers for PO challenge.  Pt reports decreased abdominal pain.  Pt has not had instances of vomiting since 10 minutes after arrival to ER.  Respirations even and unlabored.  NADN.

## 2020-10-11 ENCOUNTER — Encounter (HOSPITAL_COMMUNITY): Payer: Self-pay | Admitting: Registered Nurse

## 2020-10-11 DIAGNOSIS — F29 Unspecified psychosis not due to a substance or known physiological condition: Secondary | ICD-10-CM | POA: Insufficient documentation

## 2020-10-11 DIAGNOSIS — F19951 Other psychoactive substance use, unspecified with psychoactive substance-induced psychotic disorder with hallucinations: Secondary | ICD-10-CM

## 2020-10-11 DIAGNOSIS — T5492XA Toxic effect of unspecified corrosive substance, intentional self-harm, initial encounter: Secondary | ICD-10-CM

## 2020-10-11 DIAGNOSIS — T5491XA Toxic effect of unspecified corrosive substance, accidental (unintentional), initial encounter: Secondary | ICD-10-CM | POA: Insufficient documentation

## 2020-10-11 NOTE — Telephone Encounter (Signed)
Attempted to call mother back with [place] Research officer, trade union. If mother calls back would let her know that we also are unable to give her information. However, the hospital team will set Dwayne Gallagher up with a follow up visit with [place] as well as psychiatric services.

## 2020-10-11 NOTE — ED Provider Notes (Signed)
Patient calm and cooperative at this time.  Denies any SI.  Waiting for disposition from behavioral health   Lorre Nick, MD 10/11/20 [phone]

## 2020-10-11 NOTE — Discharge Instructions (Signed)
For your behavioral health needs, you are advised to continue treatment at [facility].  You have an appointment with Zena Amos, MD on Thursday, November 30, 2020 at 9:00 am:       [facility]      [address]      [place], [place] [address]      [phone]

## 2020-10-11 NOTE — BH Assessment (Signed)
BHH Assessment Progress Note  Per Shuvon Rankin, NP, this pt does not require psychiatric hospitalization at this time.  Pt presents under IVC initiated by EDP Dione Booze, MD which has been rescinded by EDP Lorre Nick, MD.  Pt is psychiatrically cleared.  Discharge instructions advise pt to continue treatment with [facility], including an appointment with Zena Amos, MD on Thursday, 11/30/20 at 09:00.  Dr Freida Busman and pt's nurse, Morrie Sheldon, have been notified.  Doylene Canning, MA Triage Specialist [phone]

## 2020-10-11 NOTE — Consult Note (Signed)
Telepsych Consultation   Reason for Consult:  Suicide attempt and delusional thoughts Referring Physician:  Dione BoozeGlick, David, MD [facility] Location of Provider: Other: [facility]  Patient Identification: Fatima SangerWilliam Gomez Ramirez MRN:  [id number] Principal Diagnosis: Substance-induced psychotic disorder with hallucinations (HCC) Diagnosis:  Principal Problem:   Substance-induced psychotic disorder with hallucinations (HCC)   Total Time spent with patient: 30 minutes  Subjective:   Fatima SangerWilliam Gomez Ramirez is a 19 y.o. male patient [facility] after present  Voluntarily via EMS after ingesting bleach in suicide attempt.  HPI:  Fatima SangerWilliam Gomez Ramirez, 19 y.o., male patient seen via tele psych by this provider, consulted with Dr. Lucianne MussKumar; and chart reviewed on 10/11/20.  On evaluation Fatima SangerWilliam Gomez Ramirez reports he drank the bleach because no one would believe what he was saying and he felt that he was alone.  Patient states that he and his girlfriend along with some other friends went to a cook out over the weekend.  States while at [employer] out he started to feel funny and felt like his drink was laced or something happened "Cause it was like a deja vu moment.  It felt like I had been there or done this before.  I heard my girlfriend moaning and thought she was cheating on me.  I haven't done drugs since I went to rehab.  I been clean.  But when I tried to tell my mom and dad what was happening nobody would listen, they thought I was on drugs or something."  Patient states he feels better and has spoken to his mother since he has been in the hospital.   During evaluation Fatima SangerWilliam Gomez Ramirez is alert/oriented x 4; calm/cooperative; and mood is congruent with affect.  He does not appear to be responding to internal/external stimuli or delusional thoughts.  Patient denies suicidal/self-harm/homicidal ideation, psychosis, and paranoia.  Patient answered question appropriately.    Collateral  Information:  Spoke to patients mother Danie Chandleroemi Ramirez at [phone].  Mrs. Derrell LollingRamirez states that she has spoken to patient and he appears to be back to his normal self.  States she is unsure what happened "He had been doing so good; I don't know what happened."  States she doesn't feel that patient is a danger to himself and would be fine for patient to come home.  When ready for his discharge can call and she can pick up.  Would like patient to be set up for outpatient psychiatric services and prescription for medications.     Past Psychiatric History: Substance abuse; substance induced psychosis   Risk to Self: Suicidal Ideation: No-Not Currently/Within Last 6 Months Suicidal Intent: No-Not Currently/Within Last 6 Months Is patient at risk for suicide?: Yes Suicidal Plan?: No-Not Currently/Within Last 6 Months Access to Means: Yes Specify Access to Suicidal Means: patient drank bleach What has been your use of drugs/alcohol within the last 12 months?: occasional THC use How many times?: 0 Other Self Harm Risks: denies Triggers for Past Attempts: None known Intentional Self Injurious Behavior: None Risk to Others: Homicidal Ideation: No Thoughts of Harm to Others: No Current Homicidal Intent: No Current Homicidal Plan: No Access to Homicidal Means: No Identified Victim: denies History of harm to others?: No Assessment of Violence: None Noted Violent Behavior Description: deies Does patient have access to weapons?: No Criminal Charges Pending?: No Does patient have a court date: No Prior Inpatient Therapy: Prior Inpatient Therapy: Yes Prior Therapy Dates: 03/2020 Prior Therapy Facilty[facility]Provider[facility]  Reason for Treatment: psychosis Prior Outpatient Therapy: Prior Outpatient Therapy: Yes Prior Therapy Dates: 04/2020 Prior Therapy Facilty/Provider(s): [facility]- Dr. Evelene Croon Reason for Treatment: med management Does patient have an ACCT team?: No Does patient have Intensive In-House  Services?  : No Does patient have [facility] services? : No Does patient have P4CC services?: No  Past Medical History:  Past Medical History:  Diagnosis Date  . Allergic conjunctivitis of both eyes and rhinitis 05/09/2014  . Anxiety   . Conduct disorder 09/29/2014  . Family problems 05/09/2014  . 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 Overweight    History reviewed. No pertinent surgical history. Family History:  Family History  Problem Relation Age of Onset  . Asthma Brother   . Osteoporosis Maternal Grandmother   . Hyperlipidemia Maternal Grandmother    Family Psychiatric  History: Unaware Social History:  Social History   Substance and Sexual Activity  Alcohol Use Yes     Social History   Substance and Sexual Activity  Drug Use Yes  . Types: Marijuana    Social History   Socioeconomic History  . Marital status: Single    Spouse name: Not on file  . Number of children: Not on file  . Years of education: Not on file  . Highest education level: Not on file  Occupational History  . Not on file  Tobacco Use  . Smoking status: Current Every Day Smoker    Types: E-cigarettes  . Smokeless tobacco: Never Used  Substance and Sexual Activity  . Alcohol use: Yes  . Drug use: Yes    Types: Marijuana  . Sexual activity: Not on file  Other Topics Concern  . Not on file  Social History Narrative   Lives with parents and younger brother.   Grandparents from [place].   Social Determinants of Health   Financial Resource Strain:   . Difficulty of Paying Living Expenses: Not on file  Food Insecurity:   . Worried About Programme researcher, broadcasting/film/video in the Last Year: Not on file  . Ran Out of Food in the Last Year: Not on file  Transportation Needs:   . Lack of Transportation (Medical): Not on file  . Lack of Transportation (Non-Medical): Not on file  Physical Activity:   . Days of Exercise per Week: Not on file  . Minutes of Exercise per Session: Not on file  Stress:   . Feeling of  Stress : Not on file  Social Connections:   . Frequency of Communication with Friends and Family: Not on file  . Frequency of Social Gatherings with Friends and Family: Not on file  . Attends Religious Services: Not on file  . Active Member of Clubs or Organizations: Not on file  . Attends Banker Meetings: Not on file  . Marital Status: Not on file   Additional Social History:    Allergies:  No Known Allergies  Labs:  Results for orders placed or performed during the hospital encounter of 10/09/20 (from the past 48 hour(s))  Acetaminophen level     Status: Abnormal   Collection Time: 10/10/20 12:27 AM  Result Value Ref Range   Acetaminophen (Tylenol), Serum <10 (L) 10 - 30 ug/mL    Comment: (NOTE) Therapeutic concentrations vary significantly. A range of 10-30 ug/mL  may be an effective concentration for many patients. However, some  are best treated at concentrations outside of this range. Acetaminophen concentrations >150 ug/mL at 4 hours after ingestion  and >50 ug/mL at 12  hours after ingestion are often associated with  toxic reactions.  Performed at [facility], [address] [place], [place] [address]   Comprehensive metabolic panel     Status: Abnormal   Collection Time: 10/10/20 12:27 AM  Result Value Ref Range   Sodium 138 135 - 145 mmol/L   Potassium 3.5 3.5 - 5.1 mmol/L   Chloride 105 98 - 111 mmol/L   CO2 24 22 - 32 mmol/L   Glucose, Bld 109 (H) 70 - 99 mg/dL    Comment: Glucose reference range applies only to samples taken after fasting for at least 8 hours.   BUN 18 6 - 20 mg/dL   Creatinine, Ser 3.82 0.61 - 1.24 mg/dL   Calcium 8.9 8.9 - 50.5 mg/dL   Total Protein 7.3 6.5 - 8.1 g/dL   Albumin 4.2 3.5 - 5.0 g/dL   AST 18 15 - 41 U/L   ALT 14 0 - 44 U/L   Alkaline Phosphatase 73 38 - 126 U/L   Total Bilirubin 0.5 0.3 - 1.2 mg/dL   GFR, Estimated >39 >76 mL/min    Comment: (NOTE) Calculated using the CKD-EPI  Creatinine Equation (2021)    Anion gap 9 5 - 15    Comment: Performed at [facility], [address], [place], [place] [address]  Ethanol     Status: None   Collection Time: 10/10/20 12:27 AM  Result Value Ref Range   Alcohol, Ethyl (B) <10 <10 mg/dL    Comment: (NOTE) Lowest detectable limit for serum alcohol is 10 mg/dL.  For medical purposes only. Performed at [facility], [address] [place], [place] [address]   Salicylate level     Status: Abnormal   Collection Time: 10/10/20 12:27 AM  Result Value Ref Range   Salicylate Lvl <7.0 (L) 7.0 - 30.0 mg/dL    Comment: Performed at [facility], [address], [place], [place] [address]  CBC with Differential     Status: Abnormal   Collection Time: 10/10/20 12:27 AM  Result Value Ref Range   WBC 11.4 (H) 4.0 - 10.5 K/uL   RBC 4.89 4.22 - 5.81 MIL/uL   Hemoglobin 14.8 13.0 - 17.0 g/dL   HCT 53.2 39 - 52 %   MCV 90.0 80.0 - 100.0 fL   MCH 30.3 26.0 - 34.0 pg   MCHC 33.6 30.0 - 36.0 g/dL   RDW 99.2 42.6 - 83.4 %   Platelets 248 150 - 400 K/uL   nRBC 0.0 0.0 - 0.2 %   Neutrophils Relative % 76 %   Neutro Abs 8.6 (H) 1.7 - 7.7 K/uL   Lymphocytes Relative 16 %   Lymphs Abs 1.8 0.7 - 4.0 K/uL   Monocytes Relative 7 %   Monocytes Absolute 0.8 0.1 - 1.0 K/uL   Eosinophils Relative 1 %   Eosinophils Absolute 0.1 0.0 - 0.5 K/uL   Basophils Relative 0 %   Basophils Absolute 0.1 0.0 - 0.1 K/uL   Immature Granulocytes 0 %   Abs Immature Granulocytes 0.04 0.00 - 0.07 K/uL    Comment: Performed at [facility], [address], [place], [place] [address]  Lithium level     Status: Abnormal   Collection Time: 10/10/20 12:39 AM  Result Value Ref Range   Lithium Lvl <0.06 (L) 0.60 - 1.20 mmol/L    Comment: Performed at [facility], [address] [place], [place] [address]  Urinalysis, Routine w reflex microscopic     Status: Abnormal    Collection Time: 10/10/20  1:12 AM  Result Value Ref Range   Color, Urine YELLOW YELLOW   APPearance HAZY (A) CLEAR   Specific Gravity, Urine 1.023 1.005 - 1.030   pH 6.0 5.0 - 8.0   Glucose, UA NEGATIVE NEGATIVE mg/dL   Hgb urine dipstick NEGATIVE NEGATIVE   Bilirubin Urine NEGATIVE NEGATIVE   Ketones, ur NEGATIVE NEGATIVE mg/dL   Protein, ur NEGATIVE NEGATIVE mg/dL   Nitrite NEGATIVE NEGATIVE   Leukocytes,Ua NEGATIVE NEGATIVE    Comment: Performed at [facility], [address], [place], [place] [address]  Urine rapid drug screen (hosp performed)     Status: Abnormal   Collection Time: 10/10/20  1:12 AM  Result Value Ref Range   Opiates NONE DETECTED NONE DETECTED   Cocaine NONE DETECTED NONE DETECTED   Benzodiazepines NONE DETECTED NONE DETECTED   Amphetamines NONE DETECTED NONE DETECTED   Tetrahydrocannabinol POSITIVE (A) NONE DETECTED   Barbiturates NONE DETECTED NONE DETECTED    Comment: (NOTE) DRUG SCREEN FOR MEDICAL PURPOSES ONLY.  IF CONFIRMATION IS NEEDED FOR ANY PURPOSE, NOTIFY LAB WITHIN 5 DAYS.  LOWEST DETECTABLE LIMITS FOR URINE DRUG SCREEN Drug Class                     Cutoff (ng/mL) Amphetamine and metabolites    1000 Barbiturate and metabolites    200 Benzodiazepine                 200 Tricyclics and metabolites     300 Opiates and metabolites        300 Cocaine and metabolites        300 THC                            50 Performed at [facility], [address], [place], [place] [address]   Resp Panel by RT-PCR (Flu A&B, Covid) Nasopharyngeal Swab     Status: None   Collection Time: 10/10/20  2:45 AM   Specimen: Nasopharyngeal Swab; Nasopharyngeal(NP) swabs in vial transport medium  Result Value Ref Range   SARS Coronavirus 2 by RT PCR NEGATIVE NEGATIVE    Comment: (NOTE) SARS-CoV-2 target nucleic acids are NOT DETECTED.  The SARS-CoV-2 RNA is generally detectable in upper respiratory specimens during the  acute phase of infection. The lowest concentration of SARS-CoV-2 viral copies this assay can detect is 138 copies/mL. A negative result does not preclude SARS-Cov-2 infection and should not be used as the sole basis for treatment or other patient management decisions. A negative result may occur with  improper specimen collection/handling, submission of specimen other than nasopharyngeal swab, presence of viral mutation(s) within the areas targeted by this assay, and inadequate number of viral copies(<138 copies/mL). A negative result must be combined with clinical observations, patient history, and epidemiological information. The expected result is Negative.  Fact Sheet for Patients:  BloggerCourse.com  Fact Sheet for Healthcare Providers:  SeriousBroker.it  This test is no t yet approved or cleared by the [place] FDA and  has been authorized for detection and/or diagnosis of SARS-CoV-2 by FDA under an Emergency Use Authorization (EUA). This EUA will remain  in effect (meaning this test can be used) for the duration of the COVID-19 declaration under Section 564(b)(1) of the Act, 21 U.S.C.section 360bbb-3(b)(1), unless the authorization is terminated  or  revoked sooner.       Influenza A by PCR NEGATIVE NEGATIVE   Influenza B by PCR NEGATIVE NEGATIVE    Comment: (NOTE) The Xpert Xpress SARS-CoV-2/FLU/RSV plus assay is intended as an aid in the diagnosis of influenza from Nasopharyngeal swab specimens and should not be used as a sole basis for treatment. Nasal washings and aspirates are unacceptable for Xpert Xpress SARS-CoV-2/FLU/RSV testing.  Fact Sheet for Patients: BloggerCourse.com  Fact Sheet for Healthcare Providers: SeriousBroker.it  This test is not yet approved or cleared by the [place] FDA and has been authorized for detection and/or diagnosis of  SARS-CoV-2 by FDA under an Emergency Use Authorization (EUA). This EUA will remain in effect (meaning this test can be used) for the duration of the COVID-19 declaration under Section 564(b)(1) of the Act, 21 U.S.C. section 360bbb-3(b)(1), unless the authorization is terminated or revoked.  Performed at [facility], [address], [place], [place] [address]     Medications:  Current Facility-Administered Medications  Medication Dose Route Frequency Provider Last Rate Last Admin  . acetaminophen (TYLENOL) tablet 650 mg  650 mg Oral Q4H PRN Dione Booze, MD      . alum & mag hydroxide-simeth (MAALOX/MYLANTA) 200-200-20 MG/5ML suspension 30 mL  30 mL Oral Q6H PRN Dione Booze, MD      . lithium carbonate (LITHOBID) CR tablet 300 mg  300 mg Oral Q12H Carollee Nussbaumer B, NP   300 mg at 10/11/20 0957  . nicotine (NICODERM CQ - dosed in mg/24 hours) patch 21 mg  21 mg Transdermal Daily Dione Booze, MD      . OLANZapine zydis [facility]) disintegrating tablet 5 mg  5 mg Oral Daily Atreus Hasz B, NP   5 mg at 10/11/20 0957  . ondansetron (ZOFRAN) tablet 4 mg  4 mg Oral Q8H PRN Dione Booze, MD      . zolpidem [facility]) tablet 5 mg  5 mg Oral QHS PRN Dione Booze, MD       Current Outpatient Medications  Medication Sig Dispense Refill  . doxycycline (VIBRAMYCIN) 100 MG capsule Take 1 capsule (100 mg total) by mouth daily. 30 capsule 1  . RETIN-A 0.025 % cream Apply topically at bedtime. 45 g 11    Musculoskeletal: Strength & Muscle Tone: within normal limits Gait & Station: normal Patient leans: N/A  Psychiatric Specialty Exam: Physical Exam Vitals and nursing note reviewed. Chaperone present: Sitter at bedside.  Constitutional:      General: He is not in acute distress.    Appearance: Normal appearance. He is not ill-appearing.  HENT:     Head: Normocephalic.     Right Ear: There is no impacted cerumen.  Eyes:     Pupils: Pupils are equal, round, and reactive to  light.  Cardiovascular:     Rate and Rhythm: Normal rate.  Pulmonary:     Effort: Pulmonary effort is normal.  Musculoskeletal:        General: Normal range of motion.     Cervical back: Normal range of motion.  Skin:    General: Skin is warm and dry.  Neurological:     Mental Status: He is alert and oriented to person, place, and time.  Psychiatric:        Attention and Perception: Attention and perception normal. He does not perceive auditory or visual hallucinations.        Mood and Affect: Mood and affect normal.        Speech:  Speech normal.        Behavior: Behavior normal. Behavior is cooperative.        Thought Content: Thought content normal. Thought content is not paranoid or delusional. Thought content does not include homicidal or suicidal ideation.        Cognition and Memory: Cognition and memory normal.        Judgment: Judgment normal.     Review of Systems  Constitutional: Negative.   HENT: Negative.   Eyes: Negative.   Respiratory: Negative.   Cardiovascular: Negative.   Gastrointestinal: Negative.   Genitourinary: Negative.   Musculoskeletal: Negative.   Skin: Negative.   Neurological: Negative.   Hematological: Negative.   Psychiatric/Behavioral: Negative for agitation, behavioral problems, decreased concentration, self-injury and sleep disturbance. Suicidal ideas: Denies. The patient is not hyperactive.        Patient stating he is feeling better this morning.  States he doesn't feel he needs to be in hospital and everything that happened occurred because "I think I was laced with something"     Blood pressure (!) 133/91, pulse 72, temperature 98.8 F (37.1 C), temperature source Oral, resp. rate 18, height 5\' 8"  (1.727 m), weight 73.5 kg, SpO2 100 %.Body mass index is 24.63 kg/m.  General Appearance: Casual  Eye Contact:  Good  Speech:  Clear and Coherent and Normal Rate  Volume:  Normal  Mood:  Anxious  Affect:  Appropriate and Congruent  Thought  Process:  Coherent, Goal Directed and Descriptions of Associations: Intact  Orientation:  Full (Time, Place, and Person)  Thought Content:  WDL  Suicidal Thoughts:  No  Homicidal Thoughts:  No  Memory:  Immediate;   Good Recent;   Good  Judgement:  Intact  Insight:  Present  Psychomotor Activity:  Normal  Concentration:  Concentration: Good and Attention Span: Good  Recall:  Good  Fund of Knowledge:  Fair  Language:  Good  Akathisia:  No  Handed:  Right  AIMS (if indicated):     Assets:  Communication Skills Desire for Improvement Housing Social Support  ADL's:  Intact  Cognition:  WNL  Sleep:        Treatment Plan Summary: Plan Outpatient psychiatric services    Discharge Instructions     For your behavioral health needs, you are advised to continue treatment at [facility].  You have an appointment with [facility], MD on Thursday, November 30, 2020 at 9:00 am:       [facility]      [address]      [place], [place]      [phone]      Disposition: No evidence of imminent risk to self or others at present.   Patient does not meet criteria for psychiatric inpatient admission. Supportive therapy provided about ongoing stressors. Discussed crisis plan, support from social network, calling 911, coming to the Emergency Department, and calling Suicide Hotline.  This service was provided via telemedicine using a 2-way, interactive audio and video technology.  Names of all persons participating in this telemedicine service and their role in this encounter. Name: [phone] Role: NP  Name: Dr. Assunta Found Role: Psychiatrist  Name: Nelly Rout Role: Patient  Name: Anthoney Harada Role: Patients mother    Secure message sent to Dr. Danie Chandler informing of disposition.    Maveryck Bahri, NP 10/11/2020 12:43 PM

## 2020-10-11 NOTE — Telephone Encounter (Signed)
Thank you Irving Burton for reaching out to mother.  I just saw this note and have not called mother.  I can try tomorrow.

## 2020-10-11 NOTE — ED Notes (Signed)
Pt on phone at this time 

## 2020-10-12 NOTE — Telephone Encounter (Signed)
So sorry for the delayed response. Looks like mom spoke with the front desk yesterday and has an appointment scheduled with Dahlia Client, [facility].

## 2020-10-12 NOTE — Telephone Encounter (Signed)
Dahlia Client, if you need support with this family please let me know. If they are looking for a psychiatrist, Dr. Evelene Croon with [facility] would likely be a good option.

## 2020-10-13 NOTE — Telephone Encounter (Signed)
FYI: From ED discharge notes. Patient has an appt with Zena Amos, MD on Thursday, November 30, 2020 at 9:00 am:        [facility]      [address]      [place], [place] [address]      [phone]

## 2020-10-15 ENCOUNTER — Other Ambulatory Visit: Payer: Self-pay | Admitting: Pediatrics

## 2020-10-15 DIAGNOSIS — L700 Acne vulgaris: Secondary | ICD-10-CM

## 2020-10-16 ENCOUNTER — Institutional Professional Consult (permissible substitution): Payer: Medicaid Other | Admitting: Licensed Clinical Social Worker

## 2020-10-24 ENCOUNTER — Ambulatory Visit: Payer: Medicaid Other | Admitting: Pediatrics

## 2020-10-26 ENCOUNTER — Ambulatory Visit (INDEPENDENT_AMBULATORY_CARE_PROVIDER_SITE_OTHER): Payer: Medicaid Other | Admitting: Licensed Clinical Social Worker

## 2020-10-26 ENCOUNTER — Other Ambulatory Visit: Payer: Self-pay

## 2020-10-26 DIAGNOSIS — F4320 Adjustment disorder, unspecified: Secondary | ICD-10-CM

## 2020-10-26 DIAGNOSIS — Z8659 Personal history of other mental and behavioral disorders: Secondary | ICD-10-CM | POA: Diagnosis not present

## 2020-10-26 NOTE — BH Specialist Note (Signed)
Integrated Behavioral Health Initial In-Person Visit  MRN: [id number] Name: Naftula Donahue  Number of Integrated Behavioral Health Clinician visits:: 1/6 Session Start time: 9:37  Session End time: 10:05 Total time: 28 minutes  Types of Service: Individual psychotherapy  Interpretor:No. Interpretor Name and Language: n/a   Warm Hand Off Completed.       Subjective: Lavar Rosenzweig is a 19 y.o. male accompanied by self Patient was referred by Dr. Luna Fuse for safety check following hospital discharge. Patient reports the following symptoms/concerns: Pt reports feeling much better in his mood than he did before he went into the hospital. Pt reports over thinking sometimes, and being interested to find out if his drug use has resulted in brain damage. Pt reports that he has not used drugs since discharge, nor has he experienced any SI. Pt has upcoming appt at [facility]. Duration of problem: years of mental health concerns; Severity of problem: moderate  Objective: Mood: Anxious and Euthymic and Affect: Appropriate Risk of harm to self or others: No plan to harm self or others  Life Context: Family and Social: Lives w/ parents, has supportive friends and girlfriend School/Work: N/A Self-Care: Pt reports talking to girlfriend is helpful for him; has upcoming appt w/ OPT Life Changes: Covid, hospital discharge  Patient and/or Family's Strengths/Protective Factors: Concrete supports in place (healthy food, safe environments, etc.) and Sense of purpose  Goals Addressed: Patient will: 1. Demonstrate ability to: Increase adequate support systems for patient/family  Progress towards Goals: Ongoing  Interventions: Interventions utilized: Supportive Counseling, Psychoeducation and/or Health Education and safety check  Standardized Assessments completed: None at this time, PHQ-SADS at follow up  Patient and/or Family Response: Pt reports that he  feels stable and safe with himself  Assessment: Patient currently experiencing ongoing mental health concerns, with recent hospital discharge following SI. Pt reports no current SI.   Patient may benefit from continued bridge support from this clinic until OPT can be established.  Plan: 1. Follow up with behavioral health clinician on : 11/07/20 2. Behavioral recommendations: Pt will return for IBH 3. Referral(s): Community Mental Health Services (LME/Outside Clinic); upcoming appt scheduled 4. "From scale of 1-10, how likely are you to follow plan?": Pt voiced understanding and agreement  Jama Flavors, [facility]

## 2020-11-07 ENCOUNTER — Other Ambulatory Visit: Payer: Self-pay

## 2020-11-07 ENCOUNTER — Encounter: Payer: Medicaid Other | Admitting: Licensed Clinical Social Worker

## 2020-11-08 ENCOUNTER — Ambulatory Visit (INDEPENDENT_AMBULATORY_CARE_PROVIDER_SITE_OTHER): Payer: Medicaid Other | Admitting: Pediatrics

## 2020-11-08 VITALS — HR 101 | Temp 102.9°F | Wt 185.0 lb

## 2020-11-08 DIAGNOSIS — R509 Fever, unspecified: Secondary | ICD-10-CM | POA: Diagnosis not present

## 2020-11-08 LAB — POC INFLUENZA A&B (BINAX/QUICKVUE)
Influenza A, POC: NEGATIVE
Influenza B, POC: NEGATIVE

## 2020-11-08 LAB — POCT RAPID STREP A (OFFICE): Rapid Strep A Screen: NEGATIVE

## 2020-11-08 LAB — POC SOFIA SARS ANTIGEN FIA: SARS:: NEGATIVE

## 2020-11-08 MED ORDER — IBUPROFEN 600 MG PO TABS: 600.0000 mg | ORAL_TABLET | Freq: Four times a day (QID) | ORAL | 0 refills | Status: DC | PRN

## 2020-11-08 MED ORDER — IBUPROFEN 100 MG/5ML PO SUSP
400.0000 mg | Freq: Once | ORAL | Status: AC
  Administered 2020-11-08: 17:00:00 400 mg via ORAL

## 2020-11-08 NOTE — Patient Instructions (Signed)

## 2020-11-08 NOTE — Progress Notes (Signed)
Subjective:    Dwayne Gallagher is a 19 y.o. old male here with his mother for Fever (Started yesterday, he felt hot), Sore Throat (yesterday), and Generalized Body Aches (Started yesterday, Nyquil today) .    No interpreter necessary.  HPI   2 day history fever  Off and on 101-103. Cough, chest discomfort, sore throat, neck pain, and body aches. Nyquil and dayquil and tylenol are helping a little. No emesis or diarrhea. Loss of appetite but drinking OK. Normal UO.  Last dose tylenol 8 hours ago.   Mom was sick last week. Now she is improving. No known covid exposure in past 2 weeks.   Intentional ingestion bleach 10/09/20-past history SI and adjustment disorder. has appointment with PCP 11/14/2020  Review of Systems  History and Problem List: Dwayne Gallagher has Conduct disorder; Marijuana abuse; Vitamin D insufficiency; Acne vulgaris; Substance-induced psychotic disorder with hallucinations (HCC); Substance induced mood disorder (HCC); Psychosis (HCC); and Ingestion of bleach on their problem list.  Dwayne Gallagher  has a past medical history of Allergic conjunctivitis of both eyes and rhinitis (05/09/2014), Anxiety, Conduct disorder (09/29/2014), Family problems (05/09/2014), Marijuana abuse (09/29/2014), and Overweight.  Immunizations needed: Has had Flu but not covid vaccine.      Objective:    Pulse (!) 101   Temp (!) 102.9 F (39.4 C) (Oral)   Wt 185 lb (83.9 kg)   SpO2 98%   BMI 28.13 kg/m  Physical Exam Vitals reviewed.  Constitutional:      General: He is not in acute distress.    Appearance: He is ill-appearing. He is not toxic-appearing.  HENT:     Right Ear: Tympanic membrane normal.     Left Ear: Tympanic membrane normal.     Mouth/Throat:     Mouth: Mucous membranes are moist. No oral lesions.     Pharynx: Posterior oropharyngeal erythema present. No pharyngeal swelling, oropharyngeal exudate or uvula swelling.     Tonsils: No tonsillar exudate or tonsillar abscesses.  Eyes:      Conjunctiva/sclera: Conjunctivae normal.  Cardiovascular:     Rate and Rhythm: Normal rate and regular rhythm.     Heart sounds: No murmur heard.   Pulmonary:     Effort: Pulmonary effort is normal. No respiratory distress.     Breath sounds: Normal breath sounds. No wheezing or rales.  Chest:     Chest wall: No tenderness.  Abdominal:     General: Bowel sounds are normal.     Palpations: Abdomen is soft. There is no mass.  Musculoskeletal:     Cervical back: Neck supple.  Lymphadenopathy:     Cervical: No cervical adenopathy.  Skin:    Capillary Refill: Capillary refill takes less than 2 seconds.     Findings: No rash.  Neurological:     Mental Status: He is alert.    Results for orders placed or performed in visit on 11/08/20 (from the past 24 hour(s))  POCT rapid strep A     Status: Normal   Collection Time: 11/08/20  4:31 PM  Result Value Ref Range   Rapid Strep A Screen Negative Negative  POC SOFIA Antigen FIA     Status: Normal   Collection Time: 11/08/20  4:52 PM  Result Value Ref Range   SARS: Negative Negative  POC Influenza A&B(BINAX/QUICKVUE)     Status: Normal   Collection Time: 11/08/20  5:29 PM  Result Value Ref Range   Influenza A, POC Negative Negative   Influenza B, POC Negative Negative  Assessment and Plan:   Dwayne Gallagher is a 19 y.o. old male with 2 day history fever body aches and sore throat.  1. Febrile illness, acute  Rapid strep, Flu and Covid negative  Throat culture pending.   - discussed maintenance of good hydration - discussed signs of dehydration - discussed management of fever - discussed expected course of illness - discussed good hand washing and use of hand sanitizer - discussed with parent to report increased symptoms or no improvement  - POC SOFIA Antigen FIA - POCT rapid strep A - ibuprofen (ADVIL) 100 MG/5ML suspension 400 mg - POC Influenza A&B(BINAX/QUICKVUE) - Culture, Group A Strep   - ibuprofen (ADVIL) 600  MG tablet; Take 1 tablet (600 mg total) by mouth every 6 (six) hours as needed for fever, headache, mild pain or moderate pain.  Dispense: 30 tablet; Refill: 0   Please follow-up if symptoms do not improve in 3-5 days or worsen on treatment.  Return if symptoms worsen or fail to improve, for And as scheduled for CPE 11/14/20.  Kalman Jewels, MD

## 2020-11-10 LAB — CULTURE, GROUP A STREP
MICRO NUMBER:: 11366463
SPECIMEN QUALITY:: ADEQUATE

## 2020-11-14 ENCOUNTER — Other Ambulatory Visit: Payer: Self-pay

## 2020-11-14 ENCOUNTER — Other Ambulatory Visit (HOSPITAL_COMMUNITY)
Admission: RE | Admit: 2020-11-14 | Discharge: 2020-11-14 | Disposition: A | Discharge status: Home / Self Care | Payer: Medicaid Other | Source: Ambulatory Visit | Attending: Pediatrics | Admitting: Pediatrics

## 2020-11-14 ENCOUNTER — Encounter: Payer: Self-pay | Admitting: Pediatrics

## 2020-11-14 ENCOUNTER — Encounter: Payer: Medicaid Other | Admitting: Licensed Clinical Social Worker

## 2020-11-14 ENCOUNTER — Ambulatory Visit (INDEPENDENT_AMBULATORY_CARE_PROVIDER_SITE_OTHER): Payer: Medicaid Other | Admitting: Pediatrics

## 2020-11-14 VITALS — BP 112/66 | HR 78 | Temp 99.1°F | Ht 67.5 in | Wt 184.6 lb

## 2020-11-14 DIAGNOSIS — Z113 Encounter for screening for infections with a predominantly sexual mode of transmission: Secondary | ICD-10-CM

## 2020-11-14 DIAGNOSIS — J069 Acute upper respiratory infection, unspecified: Secondary | ICD-10-CM

## 2020-11-14 DIAGNOSIS — F1994 Other psychoactive substance use, unspecified with psychoactive substance-induced mood disorder: Secondary | ICD-10-CM

## 2020-11-14 DIAGNOSIS — Z0001 Encounter for general adult medical examination with abnormal findings: Secondary | ICD-10-CM

## 2020-11-14 LAB — POCT RAPID HIV: Rapid HIV, POC: NEGATIVE

## 2020-11-14 NOTE — Progress Notes (Signed)
Adolescent Well Care Visit Dwayne Gallagher is a 20 y.o. male who is here for well care.    PCP:  [name], MD   History was provided by the patient.  Current Issues: Current concerns include cold symptoms - seen in clinic on 12/29 with runny nose, nasal congestion and sore throat.  Mild cough.  No fever.Taking Nyquil and Dayquil which helps.  Multiple family members in the home have been sick with similar symptoms including his father and younger brother.  History of depression, psychosis, and suicide attempt with ingestion of bleach.  Patient reports that he is doing well currently and not having any down or saw thoughts.  No thoughts of self-harm.   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  no Stressors of note: no  Education: Graduated high school.  Considering community college  Confidential Social History: Tobacco?  no Secondhand smoke exposure?  no Drugs/ETOH?  yes, drinking alcohol with friends. Gets drunk about 1-2 times per month.  Sexually Active?  yes - 1 male partner (girlfriend) Pregnancy Prevention: condoms  Screenings: The patient completed the Rapid Assessment for Adolescent Preventive Services screening questionnaire and the following topics were identified as risk factors and discussed: marijuana use and alcohol use  In addition, the following topics were discussed as part of anticipatory guidance condom use and mental health issues.  PHQ-9 completed and results indicated no signs of depression  Physical Exam:  Vitals:   11/14/20 1423  BP: 112/66   Pulse: 78  Temp: 99.1 F (37.3 C)  TempSrc: Temporal  SpO2: 98%  Weight: 184 lb 9.6 oz (83.7 kg)  Height: 5' 7.5" (1.715 m)   BP 112/66 (BP Location: Right Arm, Patient Position: Sitting, Cuff Size: Normal)   Pulse 78   Temp 99.1 F (37.3 C) (Temporal)   Ht 5' 7.5" (1.715 m)   Wt 184 lb 9.6 oz (83.7 kg)   SpO2 98%   BMI 28.49 kg/m  Body mass index: body mass index is 28.49 kg/m.    Hearing Screening   Method: Audiometry   125Hz  250Hz  500Hz  1000Hz  2000Hz  3000Hz  4000Hz  6000Hz  8000Hz   Right ear:   20 20 20  20     Left ear:   20 20 20  20       Visual Acuity Screening   Right eye Left eye Both eyes  Without correction: 20/20 20/40 20/20   With correction:       General Appearance:   alert, oriented, no acute distress  HENT: Normocephalic, no obvious abnormality, conjunctiva clear  Mouth:   Normal appearing teeth, no obvious discoloration, dental caries, or dental caps  Neck:   Supple; thyroid: no enlargement, symmetric, no tenderness/mass/nodules  Chest Normal male  Lungs:   Clear to auscultation bilaterally, normal work of breathing  Heart:   Regular rate and rhythm, S1 and S2 normal, no murmurs;   Abdomen:   Soft, non-tender, no mass, or organomegaly  GU normal male genitals, no testicular masses or hernia, Tanner stage V  Musculoskeletal:   Tone and strength strong and symmetrical, all extremities               Lymphatic:   No  cervical adenopathy  Skin/Hair/Nails:   Skin warm, dry and intact, no rashes, no bruises or petechiae, acne scars on cheeks  Neurologic:   Strength, gait, and coordination normal and age-appropriate     Assessment and Plan:   1. Encounter for general adult medical examination with abnormal findings  2. Routine screening for STI (sexually transmitted infection) - POCT Rapid HIV - Urine cytology ancillary only  3. History of psychosis and prior SI attempts Patient reports that he is currently feeling well.  He has follow-up scheduled with  psychiatry later this month.  4. Viral URI Sypmtoms are improving.  Discussed expected course and reasons to return to care.  BMI is not appropriate for age - overweight category for age.  5-2-1-0 goals of healthy active living reviewed.  Hearing screening result:normal Vision screening result: abnormal - optometry list given  Return if symptoms worsen or fail to improve.[name], MD

## 2020-11-14 NOTE — Patient Instructions (Signed)
Below is a list of adult medicine practices that have recently beenaccepting new patients.  Please reach out to one of these practices to schedule a new patient appointment as soon as possible.     Adult Primary Care Clinics Name Criteria Services   [facility]  Address: [address] [place], [place] [address]  Phone: [phone] Hours: Monday - Friday 9 AM -6 PM  Types of insurance accepted:  Marland Kitchen Nurse, learning disability . [facility] (orange card) . Medicaid . Medicare . Uninsured  Language services:  Marland Kitchen Video and phone interpreters available   Ages 44 and older    . Adult primary care . Onsite pharmacy . Integrated behavioral health . Financial assistance counseling . Walk-in hours for established patients  Financial assistance counseling hours: Tuesdays 2:00PM - 5:00PM  Thursday 8:30AM - 4:30PM  Space is limited, 10 on Tuesday and 20 on Thursday on a first come, first serve basis  Name Criteria Services   [facility]  Address: [address] [place], [place] [address]  Phone: [phone]  Hours: Monday - Friday 8:30 AM - 5 PM  Types of insurance accepted:  Marland Kitchen Nurse, learning disability . Medicaid . Medicare . Uninsured  Language services:  Marland Kitchen Video and phone interpreters available   All ages - newborn to adult   . Primary care for all ages (children and adults) . Integrated behavioral health . Nutritionist . Financial assistance counseling   Name Criteria Services   [facility]  Located on the ground floor of [facility]  Address: [address]  [place],  [place]  [address]  Phone: [phone]  Hours: Monday - Friday 8:15 AM - 5 PM  Types of insurance accepted:  Marland Kitchen Nurse, learning disability . Medicaid . Medicare . Uninsured  Language services:  Marland Kitchen Video and phone interpreters available   Ages 77 and older   . Adult primary  care . Nutritionist . Certified Diabetes Educator  . Integrated behavioral health . Financial assistance counseling   Name Criteria Services   [facility]  Address: [address] [place], [place] [address]  Phone: [phone]  Hours: Monday - Friday 8:30 AM - 5 PM    Types of insurance accepted:  Marland Kitchen Nurse, learning disability . Medicaid . Medicare . Uninsured  Language services:  Marland Kitchen Video and phone interpreters available   All ages - newborn to adult   . Primary care for all ages (children and adults) . Integrated behavioral health . Financial assistance counseling    Optometrists who accept Medicaid   Accepts Medicaid for Eye Exam and Glasses   [facility] [address] Phone: [phone]  Open Monday- Saturday from 9 AM to 5 PM Ages 6 months and older Se habla Espaol [facility] [address] [place] Phone: [phone] Open Monday -Friday (by appointment only) Ages 83 and older No se habla Espaol   [facility] [address] [place], [address] Phone: [phone] Open Monday-Saturday Ages 8 years and older Se habla Espaol  [facility] [address] [place], [place]  Phone: [phone] Open Monday-Friday Ages 5 years and older  Se habla Espaol   [facility] [place] [address] Phone: [phone] Open Monday-Friday Ages 5 and older No se habla Espaol  [facility]  [place] [address] Phone: [phone] Age 61 year old and older Open Monday-Saturday Se habla Espaol  [facility] [address] Phone: [phone] Open Monday-Friday Ages 7 and older No se habla Espaol

## 2020-11-15 LAB — URINE CYTOLOGY ANCILLARY ONLY
Chlamydia: NEGATIVE
Comment: NEGATIVE
Comment: NORMAL
Neisseria Gonorrhea: NEGATIVE

## 2020-11-25 ENCOUNTER — Ambulatory Visit: Payer: Medicaid Other

## 2020-11-30 ENCOUNTER — Telehealth (HOSPITAL_COMMUNITY): Payer: Medicaid Other | Admitting: Psychiatry

## 2020-11-30 ENCOUNTER — Other Ambulatory Visit: Payer: Self-pay

## 2020-12-09 ENCOUNTER — Ambulatory Visit: Payer: Medicaid Other

## 2020-12-10 ENCOUNTER — Ambulatory Visit (INDEPENDENT_AMBULATORY_CARE_PROVIDER_SITE_OTHER): Payer: Medicaid Other

## 2020-12-10 ENCOUNTER — Other Ambulatory Visit: Payer: Self-pay

## 2020-12-10 DIAGNOSIS — Z23 Encounter for immunization: Secondary | ICD-10-CM | POA: Diagnosis not present

## 2020-12-10 NOTE — Progress Notes (Signed)
   Covid-19 Vaccination Clinic  Name:  Dwayne Gallagher    MRN: [id number] DOB: Jul 03, 2001  12/10/2020  Mr. Dwayne Gallagher was observed post Covid-19 immunization for 15 minutes without incident. He was provided with Vaccine Information Sheet and instruction to access the V-Safe system.   Mr. Dwayne Gallagher was instructed to call 911 with any severe reactions post vaccine: Marland Kitchen Difficulty breathing  . Swelling of face and throat  . A fast heartbeat  . A bad rash all over body  . Dizziness and weakness   Immunizations Administered    Name Date Dose VIS Date Route   Pfizer COVID-19 Vaccine 12/10/2020 12:34 PM 0.3 mL 08/30/2020 Intramuscular   Manufacturer: [employer], [employer]   Lot: [id number]   NDC: [address]-2

## 2021-01-03 ENCOUNTER — Telehealth (HOSPITAL_COMMUNITY): Payer: Medicaid Other | Admitting: Psychiatry

## 2021-01-03 ENCOUNTER — Other Ambulatory Visit: Payer: Self-pay

## 2021-01-13 ENCOUNTER — Ambulatory Visit (INDEPENDENT_AMBULATORY_CARE_PROVIDER_SITE_OTHER): Payer: Medicaid Other

## 2021-01-13 DIAGNOSIS — Z23 Encounter for immunization: Secondary | ICD-10-CM

## 2021-01-13 NOTE — Progress Notes (Signed)
   Covid-19 Vaccination Clinic  Name:  Dwayne Gallagher    MRN: [id number] DOB: Jul 22, 2001  01/13/2021  Mr. Dwayne Gallagher was observed post Covid-19 immunization for 15 minutes without incident. He was provided with Vaccine Information Sheet and instruction to access the V-Safe system.   Mr. Dwayne Gallagher was instructed to call 911 with any severe reactions post vaccine: Marland Kitchen Difficulty breathing  . Swelling of face and throat  . A fast heartbeat  . A bad rash all over body  . Dizziness and weakness   Immunizations Administered    Name Date Dose VIS Date Route   PFIZER Comrnaty(Gray TOP) Covid-19 Vaccine 01/13/2021 12:16 PM 0.3 mL 10/19/2020 Intramuscular   Manufacturer: [employer], [employer]   Lot: [id number]   NDC: [phone]

## 2021-02-06 ENCOUNTER — Ambulatory Visit: Payer: Medicaid Other | Admitting: Licensed Clinical Social Worker

## 2021-08-02 ENCOUNTER — Ambulatory Visit: Payer: Medicaid Other | Admitting: Licensed Clinical Social Worker

## 2021-09-13 ENCOUNTER — Ambulatory Visit (HOSPITAL_COMMUNITY)
Admission: EM | Admit: 2021-09-13 | Discharge: 2021-09-15 | Disposition: A | Discharge status: Home / Self Care | Payer: Medicaid Other | Attending: Nurse Practitioner | Admitting: Nurse Practitioner

## 2021-09-13 ENCOUNTER — Encounter (HOSPITAL_COMMUNITY): Payer: Self-pay | Admitting: Emergency Medicine

## 2021-09-13 DIAGNOSIS — Z9151 Personal history of suicidal behavior: Secondary | ICD-10-CM | POA: Insufficient documentation

## 2021-09-13 DIAGNOSIS — U070 Vaping-related disorder: Secondary | ICD-10-CM | POA: Insufficient documentation

## 2021-09-13 DIAGNOSIS — Z79899 Other long term (current) drug therapy: Secondary | ICD-10-CM | POA: Insufficient documentation

## 2021-09-13 DIAGNOSIS — F22 Delusional disorders: Secondary | ICD-10-CM | POA: Insufficient documentation

## 2021-09-13 DIAGNOSIS — F419 Anxiety disorder, unspecified: Secondary | ICD-10-CM | POA: Insufficient documentation

## 2021-09-13 DIAGNOSIS — Z9114 Patient's other noncompliance with medication regimen: Secondary | ICD-10-CM | POA: Insufficient documentation

## 2021-09-13 DIAGNOSIS — F1290 Cannabis use, unspecified, uncomplicated: Secondary | ICD-10-CM | POA: Insufficient documentation

## 2021-09-13 DIAGNOSIS — Z20822 Contact with and (suspected) exposure to covid-19: Secondary | ICD-10-CM | POA: Insufficient documentation

## 2021-09-13 DIAGNOSIS — F319 Bipolar disorder, unspecified: Secondary | ICD-10-CM | POA: Insufficient documentation

## 2021-09-13 DIAGNOSIS — F1994 Other psychoactive substance use, unspecified with psychoactive substance-induced mood disorder: Secondary | ICD-10-CM | POA: Insufficient documentation

## 2021-09-13 LAB — POCT URINE DRUG SCREEN - MANUAL ENTRY (I-SCREEN)
POC Amphetamine UR: NOT DETECTED
POC Buprenorphine (BUP): NOT DETECTED
POC Cocaine UR: NOT DETECTED
POC Marijuana UR: POSITIVE — AB
POC Methadone UR: NOT DETECTED
POC Methamphetamine UR: NOT DETECTED
POC Morphine: NOT DETECTED
POC Oxazepam (BZO): NOT DETECTED
POC Oxycodone UR: NOT DETECTED
POC Secobarbital (BAR): NOT DETECTED

## 2021-09-13 LAB — CBC WITH DIFFERENTIAL/PLATELET
Abs Immature Granulocytes: 0.04 10*3/uL (ref 0.00–0.07)
Basophils Absolute: 0.1 10*3/uL (ref 0.0–0.1)
Basophils Relative: 1 %
Eosinophils Absolute: 0.2 10*3/uL (ref 0.0–0.5)
Eosinophils Relative: 2 %
HCT: 45 % (ref 39.0–52.0)
Hemoglobin: 15.3 g/dL (ref 13.0–17.0)
Immature Granulocytes: 0 %
Lymphocytes Relative: 22 %
Lymphs Abs: 2.1 10*3/uL (ref 0.7–4.0)
MCH: 29.8 pg (ref 26.0–34.0)
MCHC: 34 g/dL (ref 30.0–36.0)
MCV: 87.7 fL (ref 80.0–100.0)
Monocytes Absolute: 0.7 10*3/uL (ref 0.1–1.0)
Monocytes Relative: 7 %
Neutro Abs: 6.5 10*3/uL (ref 1.7–7.7)
Neutrophils Relative %: 68 %
Platelets: 322 10*3/uL (ref 150–400)
RBC: 5.13 MIL/uL (ref 4.22–5.81)
RDW: 13.2 % (ref 11.5–15.5)
WBC: 9.6 10*3/uL (ref 4.0–10.5)
nRBC: 0 % (ref 0.0–0.2)

## 2021-09-13 LAB — HEMOGLOBIN A1C
Hgb A1c MFr Bld: 5.4 % (ref 4.8–5.6)
Mean Plasma Glucose: 108.28 mg/dL

## 2021-09-13 LAB — ETHANOL: Alcohol, Ethyl (B): <10 mg/dL (ref <10)

## 2021-09-13 MED ORDER — TRAZODONE HCL 50 MG PO TABS
50.0000 mg | ORAL_TABLET | Freq: Every evening | ORAL | Status: DC | PRN
  Administered 2021-09-15: 50 mg via ORAL
  Filled 2021-09-13: qty 1

## 2021-09-13 MED ORDER — ACETAMINOPHEN 325 MG PO TABS: 650.0000 mg | ORAL_TABLET | Freq: Four times a day (QID) | ORAL | Status: DC | PRN

## 2021-09-13 MED ORDER — ALUM & MAG HYDROXIDE-SIMETH 200-200-20 MG/5ML PO SUSP: 30.0000 mL | ORAL | Status: DC | PRN

## 2021-09-13 MED ORDER — OLANZAPINE 10 MG PO TBDP
10.0000 mg | ORAL_TABLET | Freq: Once | ORAL | Status: AC
  Administered 2021-09-13: 10 mg via ORAL
  Filled 2021-09-13: qty 1

## 2021-09-13 MED ORDER — HYDROXYZINE HCL 25 MG PO TABS
25.0000 mg | ORAL_TABLET | Freq: Three times a day (TID) | ORAL | Status: DC | PRN
  Filled 2021-09-13 (×2): qty 1

## 2021-09-13 MED ORDER — MAGNESIUM HYDROXIDE 400 MG/5ML PO SUSP: 30.0000 mL | Freq: Every day | ORAL | Status: DC | PRN
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
## 2021-09-14 LAB — COMPREHENSIVE METABOLIC PANEL
ALT: 34 U/L (ref 0–44)
AST: 27 U/L (ref 15–41)
Albumin: 4.4 g/dL (ref 3.5–5.0)
Alkaline Phosphatase: 85 U/L (ref 38–126)
Anion gap: 10 (ref 5–15)
BUN: 8 mg/dL (ref 6–20)
CO2: 25 mmol/L (ref 22–32)
Calcium: 9.6 mg/dL (ref 8.9–10.3)
Chloride: 103 mmol/L (ref 98–111)
Creatinine, Ser: 0.83 mg/dL (ref 0.61–1.24)
GFR, Estimated: >60 mL/min (ref >60)
Glucose, Bld: 107 mg/dL — ABNORMAL HIGH (ref 70–99)
Potassium: 3.6 mmol/L (ref 3.5–5.1)
Sodium: 138 mmol/L (ref 135–145)
Total Bilirubin: 0.8 mg/dL (ref 0.3–1.2)
Total Protein: 7.4 g/dL (ref 6.5–8.1)

## 2021-09-14 LAB — RESP PANEL BY RT-PCR (FLU A&B, COVID) ARPGX2
Influenza A by PCR: NEGATIVE
Influenza B by PCR: NEGATIVE
SARS Coronavirus 2 by RT PCR: NEGATIVE

## 2021-09-14 LAB — LIPID PANEL
Cholesterol: 162 mg/dL (ref 0–200)
HDL: 55 mg/dL (ref >40)
LDL Cholesterol: 94 mg/dL (ref 0–99)
Total CHOL/HDL Ratio: 2.9 RATIO
Triglycerides: 63 mg/dL (ref <150)
VLDL: 13 mg/dL (ref 0–40)

## 2021-09-14 LAB — TSH: TSH: 1.46 u[IU]/mL (ref 0.350–4.500)

## 2021-09-14 LAB — LITHIUM LEVEL: Lithium Lvl: <0.06 mmol/L — ABNORMAL LOW (ref 0.60–1.20)

## 2021-09-14 MED ORDER — OLANZAPINE 5 MG PO TABS
5.0000 mg | ORAL_TABLET | Freq: Every day | ORAL | Status: DC
  Administered 2021-09-14: 5 mg via ORAL
  Filled 2021-09-14: qty 1

## 2021-09-14 NOTE — Progress Notes (Signed)
Calm and pleasant.  Quiet on unit.  Makes needs known appropriately.  NO acting out behavior and denies avh shi or plan.  Will monitor and encourage to make needs known if overwhelmed with thoughts or feelings.

## 2021-09-14 NOTE — ED Notes (Signed)
Patient is asleep in bed without distress or complaint.  Will monitor and provide a safe environmkent.  Will encourage him to express thoughts and feelings if overwhelmed.
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
## 2021-09-14 NOTE — ED Notes (Signed)
Pt in [facility] flex going from sitting in bed to pacing. A&Ox4 with no signs of acute distress, calm and cooperative during shift assessment. Pt soft spoken and denies SI, HI and AVH at present. Pt states he is depressed and wants to work with his family. Will continue to monitor for safety.

## 2021-09-14 NOTE — ED Notes (Signed)
Pt sleeping in bed in [facility] flex. Respirations even and unlabored with no signs of acute distress. Will continue to monitor for safety.

## 2021-09-15 MED ORDER — OLANZAPINE 5 MG PO TABS: 5.0000 mg | ORAL_TABLET | Freq: Every day | ORAL | 0 refills | Status: DC

## 2021-09-15 NOTE — ED Provider Notes (Signed)
FBC/OBS ASAP Discharge Summary  Date and Time: 09/15/2021 8:47 AM  Name: Dwayne Gallagher  MRN:  [id number]   Discharge Diagnoses:  Final diagnoses:  Substance induced mood disorder (HCC)    Subjective: Dwayne Gallagher, 20 y.o., male patient seen face to face by this provider, chart reviewed and discussed with Dr. Lucianne Muss on 09/15/21. Patient was admitted for overnight assessment.  Patient reports he has a history of bipolar disorder and a history of two inpatient psychiatric admission.  On evaluation Dwayne Gallagher was walking around the room.  He is in no acute distress.  He makes good eye contact, his speech is clear, coherent, normal rate and tone.  He is alert and oriented x4 and pleasant.  He endorses anxiety and depression with a congruent affect.  He does not appear to be responding to internal/external stimuli.  He denies auditory and visual hallucinations.  He endorses some paranoia, feels like others are watching him.  States maybe he feels this way because there is this thing in his culture called evil eye, when someone looks at you a certain way.   He denies racing thoughts but states, "sometimes I do not want to calm my brain down".  Patient discussed coping skills such as music that helps calm him down.  Reports his job helps him as well because he loves what he does, he works in the [employer]  He denies suicidal/homicidal ideations.  Denies access to firearms/weapons.  Patient contracts for safety at this time.  States his parents have gotten angry with him because he has posted things on social media that others take his threatening.  Patient states he is not post threatening comments or videos.  States he does not mean to offend others online.  States he is not specifically posting about anyone.  Patient reports eating/sleeping without difficulty, and tolerating medications without adverse reactions. Patient's feelings and progress discussed. Reassurance,  support, and encouragement provided.   Collateral mother and father with patient's permission.  Interpreter line was used patient's parents are Spanish speaking.  Mother and father expressed their concerns due to patient using marijuana and being verbally aggressive at times.  Father states patient does not threaten to hurt himself or anyone else, patient has never been physically aggressive, reports all situations have been verbal altercations.  Mother expressed her concern because she thinks patient is becoming forgetful.  States she will ask the patient's primary care physician if he feels there is a need for a CT/MRI.  States patient has not had any recent head injuries or MVA she is just concerned with his memory.  Mother states that patient has posted things on social media that others take as threatening, but did not elaborate.  Reports the only medication the patient is currently taking is Citalopram 10 mg p.o. daily. Discussed out patient resources. Mother states she she plans to be at behavioral health outpatient services on the second floor for an open access appointment on Monday.  Stay Summary: Dwayne Gallagher was admitted to [facility] Continuous Assessment unit for crisis management. He was treated with Zyprexa 5 mg nightly, which were tolerated with no adverse reactions.   Dwayne Gallagher was discharged with current medication and was instructed on how to take medications as prescribed; 14-day supply of Zyprexa 5 mg p.o. nightly was sent to patient's pharmacy.  He is improvement was monitored by continuous assessment.patient will follow up with the services as listed below under Follow up Information.  Upon completion of this admission the Dwayne Gallagher was both mentally and medically stable for discharge denying suicidal/homicidal ideation, auditory/visual/tactile hallucinations, delusional thoughts and paranoia.     Total Time spent with patient: 30 minutes  Past  Psychiatric History: See H&P Past Medical History:  Past Medical History:  Diagnosis Date   Allergic conjunctivitis of both eyes and rhinitis 05/09/2014   Anxiety    Conduct disorder 09/29/2014   Family problems 05/09/2014   Marijuana abuse 09/29/2014   Overweight    History reviewed. No pertinent surgical history. Family History:  Family History  Problem Relation Age of Onset   Asthma Brother    Osteoporosis Maternal Grandmother    Hyperlipidemia Maternal Grandmother    Family Psychiatric History: See H&P Social History:  Social History   Substance and Sexual Activity  Alcohol Use Yes     Social History   Substance and Sexual Activity  Drug Use Yes   Types: Marijuana    Social History   Socioeconomic History   Marital status: Single    Spouse name: Not on file   Number of children: Not on file   Years of education: Not on file   Highest education level: Not on file  Occupational History   Not on file  Tobacco Use   Smoking status: Every Day    Types: E-cigarettes   Smokeless tobacco: Never  Substance and Sexual Activity   Alcohol use: Yes   Drug use: Yes    Types: Marijuana   Sexual activity: Not on file  Other Topics Concern   Not on file  Social History Narrative   Lives with parents and younger brother.   Grandparents from [place].   Social Determinants of Health   Financial Resource Strain: Not on file  Food Insecurity: Not on file  Transportation Needs: Not on file  Physical Activity: Not on file  Stress: Not on file  Social Connections: Not on file   SDOH:  SDOH Screenings   Alcohol Screen: Not on file  Depression (PHQ2-9): Not on file  Financial Resource Strain: Not on file  Food Insecurity: Not on file  Housing: Not on file  Physical Activity: Not on file  Social Connections: Not on file  Stress: Not on file  Tobacco Use: High Risk   Smoking Tobacco Use: Every Day   Smokeless Tobacco Use: Never   Passive Exposure: Not on file   Transportation Needs: Not on file    Tobacco Cessation:  N/A, patient does not currently use tobacco products  Current Medications:  Current Facility-Administered Medications  Medication Dose Route Frequency Provider Last Rate Last Admin   acetaminophen (TYLENOL) tablet 650 mg  650 mg Oral Q6H PRN Jackelyn Poling, NP       alum & mag hydroxide-simeth (MAALOX/MYLANTA) 200-200-20 MG/5ML suspension 30 mL  30 mL Oral Q4H PRN Nira Conn A, NP       hydrOXYzine (ATARAX/VISTARIL) tablet 25 mg  25 mg Oral TID PRN Nira Conn A, NP       magnesium hydroxide (MILK OF MAGNESIA) suspension 30 mL  30 mL Oral Daily PRN Nira Conn A, NP       OLANZapine (ZYPREXA) tablet 5 mg  5 mg Oral QHS Nira Conn A, NP   5 mg at 09/14/21 2119   traZODone (DESYREL) tablet 50 mg  50 mg Oral QHS PRN Jackelyn Poling, NP   50 mg at 09/15/21 0227   No current outpatient medications on file.  PTA Medications: (Not in a hospital admission)   Musculoskeletal  Strength & Muscle Tone: within normal limits Gait & Station: normal Patient leans: N/A  Psychiatric Specialty Exam  Presentation  General Appearance: Appropriate for Environment  Eye Contact:Good  Speech:Clear and Coherent; Normal Rate  Speech Volume:Normal  Handedness:Right   Mood and Affect  Mood:Depressed; Anxious  Affect:Congruent   Thought Process  Thought Processes:Coherent  Descriptions of Associations:Intact  Orientation:Full (Time, Place and Person)  Thought Content:Logical  Diagnosis of Schizophrenia or Schizoaffective disorder in past: No  Duration of Psychotic Symptoms: Less than six months   Hallucinations:Hallucinations: None  Ideas of Reference:Paranoia  Suicidal Thoughts:Suicidal Thoughts: No  Homicidal Thoughts:Homicidal Thoughts: No   Sensorium  Memory:Immediate Fair; Recent Fair; Remote Fair  Judgment:Fair  Insight:Fair   Executive Functions  Concentration:Good  Attention  Span:Good  Recall:Good  Fund of Knowledge:Fair  Language:Good   Psychomotor Activity  Psychomotor Activity:Psychomotor Activity: Normal   Assets  Assets:Communication Skills; Desire for Improvement; Financial Resources/Insurance; Housing; Physical Health; Resilience; Social Support; Vocational/Educational   Sleep  Sleep:Sleep: Poor   No data recorded  Physical Exam  Physical Exam Vitals and nursing note reviewed.  Constitutional:      Appearance: He is well-developed.  HENT:     Head: Normocephalic and atraumatic.  Eyes:     Conjunctiva/sclera: Conjunctivae normal.  Cardiovascular:     Rate and Rhythm: Normal rate and regular rhythm.     Heart sounds: No murmur heard. Pulmonary:     Effort: Pulmonary effort is normal. No respiratory distress.     Breath sounds: Normal breath sounds.  Abdominal:     Palpations: Abdomen is soft.     Tenderness: There is no abdominal tenderness.  Musculoskeletal:     Cervical back: Neck supple.  Skin:    General: Skin is warm and dry.  Neurological:     Mental Status: He is alert and oriented to person, place, and time.  Psychiatric:        Attention and Perception: Attention and perception normal.        Mood and Affect: Mood is anxious and depressed.        Speech: Speech normal.        Behavior: Behavior normal. Behavior is cooperative.        Thought Content: Thought content is paranoid.        Cognition and Memory: Cognition normal.        Judgment: Judgment is impulsive.   Review of Systems  Constitutional: Negative.   HENT: Negative.    Eyes: Negative.   Respiratory: Negative.    Cardiovascular: Negative.   Genitourinary: Negative.   Musculoskeletal: Negative.   Skin: Negative.   Neurological: Negative.   Psychiatric/Behavioral:  Positive for depression. The patient is nervous/anxious.   Blood pressure 120/78, pulse 62, temperature 97.9 F (36.6 C), temperature source Oral, resp. rate 18, SpO2 100 %. There is no  height or weight on file to calculate BMI.  Demographic Factors:  Male, Adolescent or young adult, and Low socioeconomic status  Loss Factors: Financial problems/change in socioeconomic status  Historical Factors: Impulsivity  Risk Reduction Factors:   Sense of responsibility to family, Employed, Living with another person, especially a relative, Positive social support, Positive therapeutic relationship, and Positive coping skills or problem solving skills  Continued Clinical Symptoms:  Severe Anxiety and/or Agitation Bipolar Disorder:   Depressive phase Depression:   Impulsivity Alcohol/Substance Abuse/Dependencies  Cognitive Features That Contribute To Risk:  None  Suicide Risk:  Minimal: No identifiable suicidal ideation.  Patients presenting with no risk factors but with morbid ruminations; may be classified as minimal risk based on the severity of the depressive symptoms  Plan Of Care/Follow-up recommendations:  Activity:  as tolerated  Diet:  regular  Disposition:   Discharge patient . Encouraged patient to follow-up with his current provider, he is unsure who this is a could possibly be [employer].  Patient reports he does not have to follow-up with that practice that he could follow-up with any resources.  Discussed behavioral health outpatient services on the second for.  Discussed open access walk-in hours, encouraged patient to    check-in no later than 7:15 AM to ensure an appointment.  Zyprexa 5 mg nightly p.o.-2 weeks supply was sent to patient's pharmacy.  Hydroxyzine for anxiety was not prescribed because patient could not confirm if he had had or had not a reaction.  No evidence of imminent risk to self or others at present.    Patient does not meet criteria for psychiatric inpatient admission. Discussed crisis plan, support from social network, calling 911, coming to the Emergency Department, and calling Suicide Hotline.  Ardis Hughs,  NP 09/15/2021, 8:47 AM

## 2021-09-15 NOTE — ED Notes (Signed)
Pt awake and alert x4. Blunted affect.   Ambulatory  with moderate pacing noted.    Reports that " I am not suicidal..I just want to go home".   Pt redirectable and pleasant upon approach.  Breakfast tray given.   Awaiting further orders on dispo.  Staff will continue to monitor for safety.

## 2021-09-15 NOTE — Progress Notes (Signed)
DISCHARGE NOTE:  Pt was discharged home via his parents that picked him up.  Pt denies SI, HI or AVH at time of discharge.  A copy of the AVS was given to pt and reviewed.  He verified understanding of medication regime and follow up appointments.  All belongings from locker 28 were returned to him.

## 2021-09-15 NOTE — ED Notes (Signed)
Pt came to staff asking for PRN medication " to slow my thinking down".   Provider notified.

## 2021-09-15 NOTE — Discharge Instructions (Addendum)

## 2021-09-15 NOTE — ED Notes (Signed)
Pt is currently sitting in chair.  He is redirectable and approachable.  Continuing to monitor for safety and awaiting dispo orders.

## 2021-09-15 NOTE — ED Notes (Signed)
Pt intermittently paces floor or watches TV. Pt requested move from flex to adult to be able to watch TV from bed. Pt given medication at 0227 to help with sleep. Pt requested OJ and salad, which RN provided. Respirations even and unlabored with no signs of acute distress. Will continue to monitor for safety.

## 2021-09-17 ENCOUNTER — Telehealth (HOSPITAL_COMMUNITY): Payer: Self-pay | Admitting: Pediatrics

## 2021-09-17 ENCOUNTER — Other Ambulatory Visit: Payer: Self-pay

## 2021-09-17 ENCOUNTER — Ambulatory Visit (HOSPITAL_COMMUNITY): Payer: Medicaid Other | Admitting: Licensed Clinical Social Worker

## 2021-09-17 DIAGNOSIS — F1994 Other psychoactive substance use, unspecified with psychoactive substance-induced mood disorder: Secondary | ICD-10-CM

## 2021-09-17 NOTE — BH Assessment (Signed)
Care Management - Follow Up [facility] Discharges   Writer attempted to make contact with patient today and was unsuccessful.  Writer left a HIPPA compliant voice message.   Per chart review, patient completed a follow up appointment today (09-17-21) with Richardson Dopp, LCSW at 8am

## 2021-09-17 NOTE — Progress Notes (Signed)
Pt came in today for f/u appointment and stated he was here under contract with his family and needed to f/u with a therapist. Pt presented today with LCSW explained the right to self-determination.  [place] presented with paranoia, tangential thought process, and irritability.  Pt stated that he posted "some stuff online" about his ex and people f/u by calling the cops. Pt was transported to [facility]. Pt stated, "My family thinks I got laced". LCSW attempted to talk to pt about drug abuse. Pt started getting upset with LCSW. LCSW again explained that he did not have to be here if he did not want to be. He said "Alright thanks boss". Then put his hand out to shake LCSW hand, LCSW engaged in the handshake. LCSW opened the door and He stated to LCSW "Do not ever raise your hand to me like that again" LCSW only shook patients' hand after he put his hand out first. He then stated, "If you ever do that again you already know what it is going to happen". LCSW excused patient from session due to inappropriate comments. If pt would like to be seen for f/u again he is welcome if threats for harm/fighting are not implied.

## 2022-03-28 ENCOUNTER — Ambulatory Visit (HOSPITAL_COMMUNITY)
Admission: EM | Admit: 2022-03-28 | Discharge: 2022-03-28 | Disposition: A | Discharge status: Home / Self Care | Payer: Self-pay | Attending: Internal Medicine | Admitting: Internal Medicine

## 2022-03-28 ENCOUNTER — Encounter (HOSPITAL_COMMUNITY): Payer: Self-pay

## 2022-03-28 DIAGNOSIS — K529 Noninfective gastroenteritis and colitis, unspecified: Secondary | ICD-10-CM

## 2022-03-28 DIAGNOSIS — R112 Nausea with vomiting, unspecified: Secondary | ICD-10-CM

## 2022-03-28 MED ORDER — ONDANSETRON 4 MG PO TBDP
ORAL_TABLET | ORAL | Status: AC
  Filled 2022-03-28: qty 1

## 2022-03-28 MED ORDER — ONDANSETRON 4 MG PO TBDP: 4.0000 mg | ORAL_TABLET | Freq: Three times a day (TID) | ORAL | 0 refills | Status: DC | PRN

## 2022-03-28 MED ORDER — ONDANSETRON 4 MG PO TBDP
4.0000 mg | ORAL_TABLET | Freq: Once | ORAL | Status: AC
  Administered 2022-03-28: 4 mg via ORAL

## 2022-03-28 NOTE — ED Triage Notes (Signed)
Onset 5 days ago started with vomiting. Pt states he works in [employer] and every time he takes a break he has to vomit. No diarrhea or SOB.

## 2022-03-28 NOTE — Discharge Instructions (Addendum)
You were seen in urgent care today for gastroenteritis.  Please take Zofran every 8 hours at home.  Your next dose of Zofran may be tomorrow morning since you were given some at the clinic today.  I have given you a work note for the next 3 days.  Please rest and take it easy.  Please follow the brat diet (bananas, rice, toast, applesauce) and eat a bland diet for the next 12 to 24 hours.  I expect your symptoms to get better in the next 1 to 2 days with Zofran.  Drink plenty of water to stay well-hydrated.  If you develop any new or worsening symptoms or do not improve in the next 2 to 3 days, please return.  If your symptoms are severe, please go to the emergency room.  Follow-up with your primary care provider for further evaluation and management of your symptoms as well as ongoing wellness visits.  I hope you feel better!

## 2022-03-31 NOTE — ED Provider Notes (Signed)
[facility]    CSN: [id number] Arrival date & time: 03/28/22  1947      History   Chief Complaint Chief Complaint  Patient presents with   Emesis    HPI Dwayne Gallagher is a 21 y.o. male.   Patient is a 21 year old male presenting to urgent care for evaluation of nausea and vomiting for the last 5 days after exertion and working outside. He works as a Administrator and reports that he has become very nauseous after eating while at work. Reports generalized abdominal cramping. Last bowel movement was today and normal. No diarrhea. Denies headache, fever, hematemesis, urinary symptoms, dizziness, and rash. He states he drinks plenty of water at work due to working outside in the heat. He has been drinking pedialyte and has been able to keep it down. Last episode of emesis was this morning. Denies eating foods out of his ordinary diet that are undercooked or raw. Denies recent camping and travel. No sick contacts or heart palpitations. He tried taking peptobismol with minimal relief of symptoms. Denies any other aggravating or relieving factors at this time.    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 No pertinent surgical history.     Home Medications    Prior to Admission medications   Medication Sig Start Date End Date Taking? Authorizing Provider  ondansetron (ZOFRAN-ODT) 4 MG disintegrating tablet  Take 1 tablet (4 mg total) by mouth every 8 (eight) hours as needed for nausea or vomiting. 03/28/22  Yes Kay Ricciuti, Donavan Burnet, FNP  OLANZapine (ZYPREXA) 5 MG tablet Take 1 tablet (5 mg total) by mouth at bedtime. 09/15/21   Ardis Hughs, NP    Family History Family History  Problem Relation Age of Onset   Asthma Brother    Osteoporosis Maternal Grandmother    Hyperlipidemia Maternal Grandmother     Social History Social History   Tobacco Use   Smoking status: Every Day    Types: E-cigarettes   Smokeless tobacco: Never  Substance Use Topics   Alcohol use: Yes   Drug use: Yes    Types: Marijuana     Allergies   Hydroxyzine and Kiwi extract   Review of Systems Review of Systems  Gastrointestinal:  Positive for vomiting.  Per HPI  Physical Exam Triage Vital Signs ED Triage Vitals  Enc Vitals Group     BP 03/28/22 2012 129/76     Pulse Rate 03/28/22 2012 86     Resp 03/28/22 2012 16     Temp 03/28/22 2012 97.8 F (36.6 C)     Temp Source 03/28/22 2012 Oral     SpO2 03/28/22 2012 98 %     Weight --      Height --      Head Circumference --      Peak Flow --      Pain Score 03/28/22 2044 4  Pain Loc --      Pain Edu? --      Excl. in GC? --    No data found.  Updated Vital Signs BP 129/76 (BP Location: Left Arm)   Pulse 86   Temp 97.8 F (36.6 C) (Oral)   Resp 16   SpO2 98%   Visual Acuity Right Eye Distance:   Left Eye Distance:   Bilateral Distance:    Right Eye Near:   Left Eye Near:    Bilateral Near:     Physical Exam Vitals and nursing note reviewed.  Constitutional:      General: He is not in acute distress.    Appearance: Normal appearance. He is well-developed. He is not ill-appearing.     Comments: Very pleasant patient sitting comfortably on exam able in no acute distress.   HENT:     Head: Normocephalic and atraumatic.     Right Ear: Tympanic membrane, ear canal and external ear normal.     Left Ear: Tympanic membrane,  ear canal and external ear normal.     Nose: Nose normal. No rhinorrhea.     Mouth/Throat:     Mouth: Mucous membranes are moist.     Pharynx: No posterior oropharyngeal erythema.  Eyes:     General: Lids are normal. Vision grossly intact. Gaze aligned appropriately.     Extraocular Movements: Extraocular movements intact.     Conjunctiva/sclera: Conjunctivae normal.     Right eye: Right conjunctiva is not injected.     Left eye: Left conjunctiva is not injected.  Cardiovascular:     Rate and Rhythm: Normal rate and regular rhythm.     Heart sounds: Normal heart sounds, S1 normal and S2 normal. No murmur heard.   No friction rub. No gallop.  Pulmonary:     Effort: Pulmonary effort is normal. No respiratory distress.     Breath sounds: Normal breath sounds. No stridor or decreased air movement.  Abdominal:     General: Abdomen is protuberant. Bowel sounds are normal. There is no distension.     Palpations: Abdomen is soft.     Tenderness: There is generalized abdominal tenderness. There is no right CVA tenderness, left CVA tenderness or guarding. Negative signs include Murphy's sign, McBurney's sign and obturator sign.  Musculoskeletal:        General: No swelling.     Cervical back: Neck supple.     Right lower leg: No edema.     Left lower leg: No edema.  Lymphadenopathy:     Cervical: No cervical adenopathy.  Skin:    General: Skin is warm.     Capillary Refill: Capillary refill takes less than 2 seconds.     Findings: No erythema or rash.  Neurological:     General: No focal deficit present.     Mental Status: He is alert and oriented to person, place, and time. Mental status is at baseline.     Sensory: No sensory deficit.     Motor: No weakness.     Gait: Gait is intact. Gait normal.  Psychiatric:        Attention and Perception: Attention and perception normal.        Mood and Affect: Mood normal.        Speech: Speech normal.        Behavior: Behavior normal.  Behavior is cooperative.        Thought Content: Thought content normal.  Cognition and Memory: Cognition and memory normal.        Judgment: Judgment normal.     UC Treatments / Results  Labs (all labs ordered are listed, but only abnormal results are displayed) Labs Reviewed - No data to display  EKG   Radiology No results found.  Procedures Procedures (including critical care time)  Medications Ordered in UC Medications  ondansetron (ZOFRAN-ODT) disintegrating tablet 4 mg (4 mg Oral Given 03/28/22 2037)    Initial Impression / Assessment and Plan / UC Course  I have reviewed the triage vital signs and the nursing notes.  Pertinent labs & imaging results that were available during my care of the patient were reviewed by me and considered in my medical decision making (see chart for details).   Patient's symptoms consistent with viral gastroenteritis. Abdominal exam normal. No peritoneal signs elicited. Zofran 4mg  ODT given in clinic with improvement of nausea. Patient to take zofran at home every 8 hours as needed for nausea and eat a bland diet for the next 12-24 hours. Next dose of Zofran tomorrow morning if needed. Given work note for next 3 days to rest and rehydrate. No systemic signs of dehydration at this time and no clinical indication for IV rehydration. Deferred imaging based on stable abdominal exam and vital signs.   Counseled patient regarding appropriate use of medications and potential side effects for all medications recommended or prescribed today. Discussed red flag signs and symptoms of worsening condition,when to call the PCP office, return to urgent care, and when to seek higher level of care. Patient verbalizes understanding and agreement with plan. All questions answered. Patient discharged in stable condition.   Final Clinical Impressions(s) / UC Diagnoses   Final diagnoses:  Nausea and vomiting, unspecified vomiting type  Gastroenteritis      Discharge Instructions      You were seen in urgent care today for gastroenteritis.  Please take Zofran every 8 hours at home.  Your next dose of Zofran may be tomorrow morning since you were given some at the clinic today.  I have given you a work note for the next 3 days.  Please rest and take it easy.  Please follow the brat diet (bananas, rice, toast, applesauce) and eat a bland diet for the next 12 to 24 hours.  I expect your symptoms to get better in the next 1 to 2 days with Zofran.  Drink plenty of water to stay well-hydrated.  If you develop any new or worsening symptoms or do not improve in the next 2 to 3 days, please return.  If your symptoms are severe, please go to the emergency room.  Follow-up with your primary care provider for further evaluation and management of your symptoms as well as ongoing wellness visits.  I hope you feel better!    ED Prescriptions     Medication Sig Dispense Auth. Provider   ondansetron (ZOFRAN-ODT) 4 MG disintegrating tablet Take 1 tablet (4 mg total) by mouth every 8 (eight) hours as needed for nausea or vomiting. 20 tablet Carlisle BeersStanhope, Randall Rampersad M, FNP      PDMP not reviewed this encounter.   Carlisle BeersStanhope, Syrina Wake M, [place]NP 03/31/22 2221

## 2022-05-15 IMAGING — CT CT HEAD W/O CM
3 series · 15 of 47 positions shown, 18 images · non-contrast
Comparison: None.

CLINICAL DATA: Patient drank unknown amount of bleach and now
intermittently vomiting.

EXAM:
CT HEAD WITHOUT CONTRAST
TECHNIQUE: Contiguous axial images were obtained from the base of the skull
through the vertex without intravenous contrast.

[Series 2: head wo · axial · 0.47mm/px · z∈[-124,+1]mm · 9 of 30 slices shown, 12 images]
[im 3/30  brain]
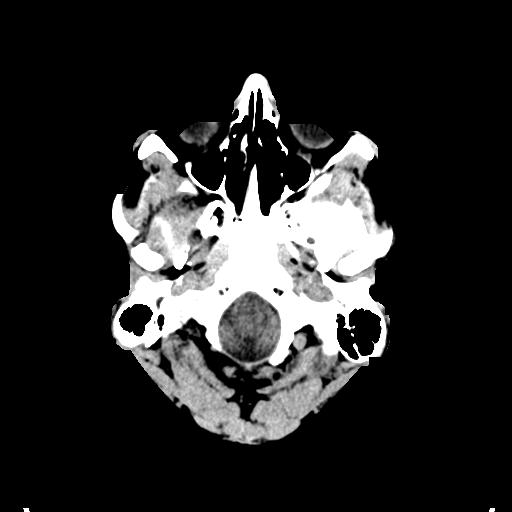
[im 3/30  bone]
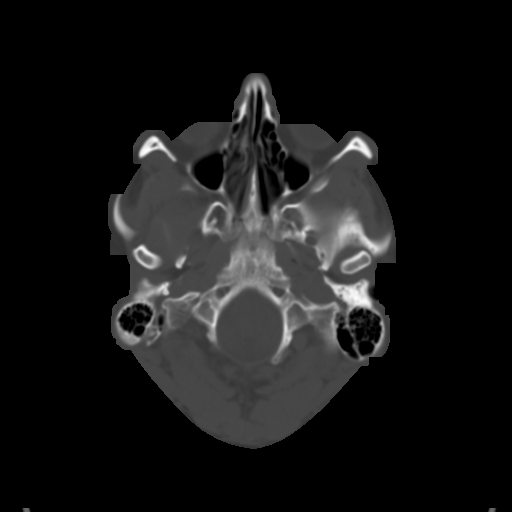
[im 6/30  brain]
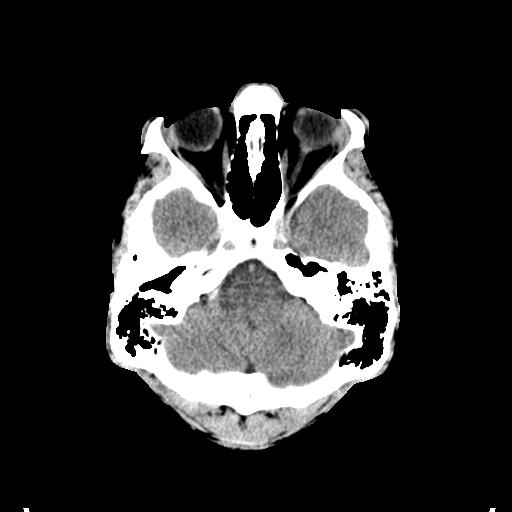
[im 9/30  brain]
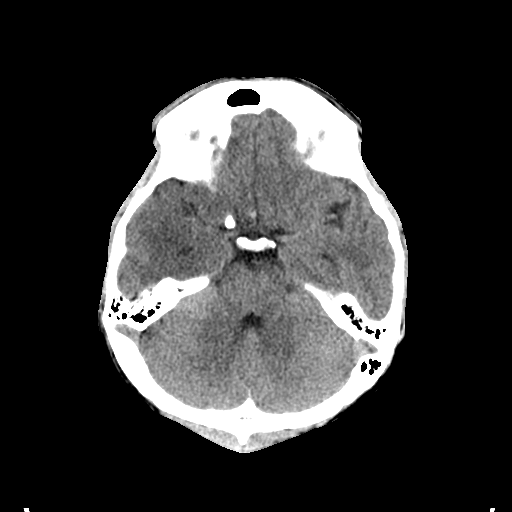
[im 12/30  brain]
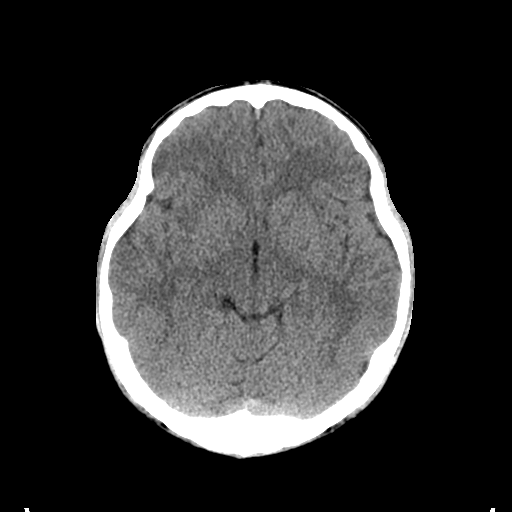
[im 16/30  brain]
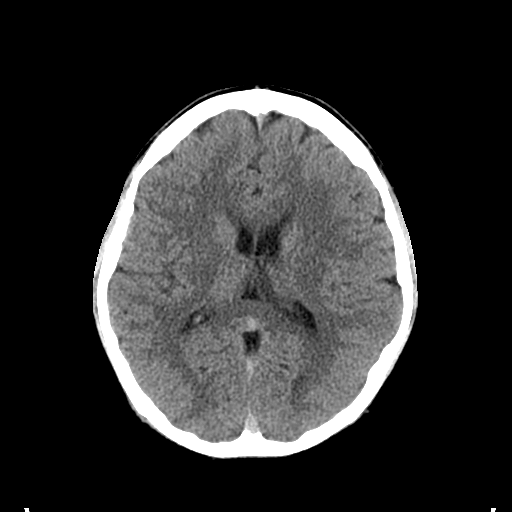
[im 16/30  bone]
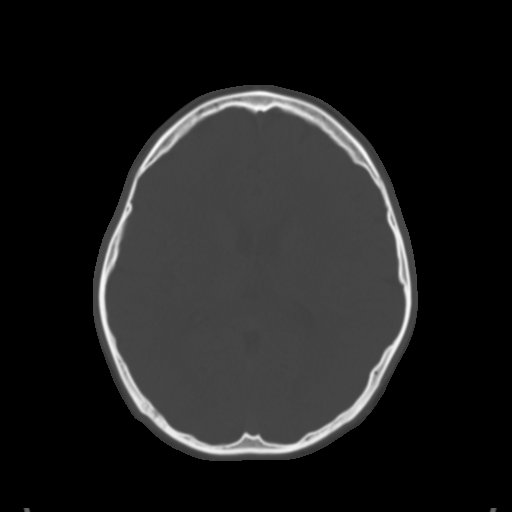
[im 19/30  brain]
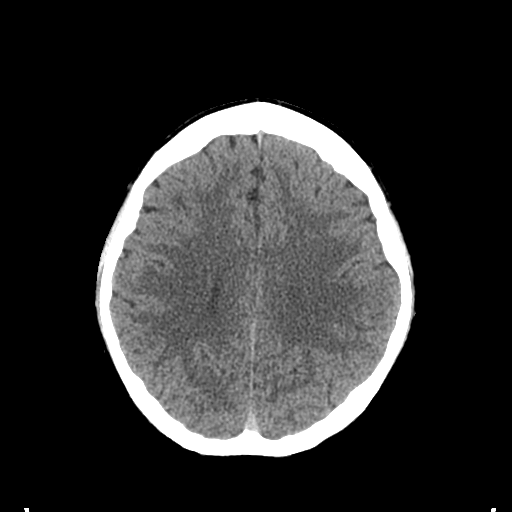
[im 22/30  brain]
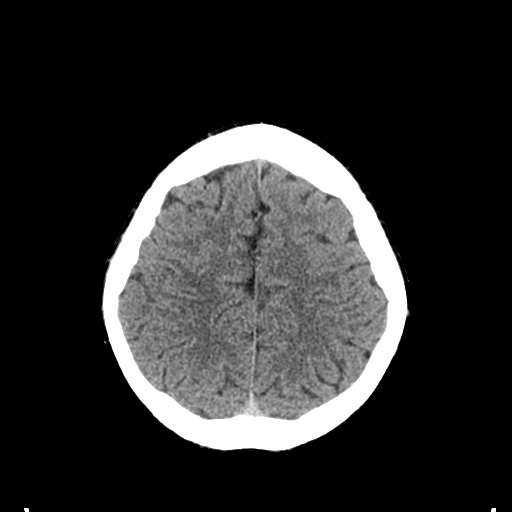
[im 25/30  brain]
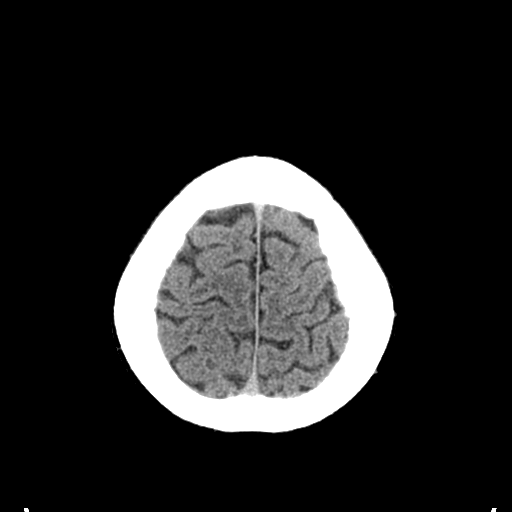
[im 28/30  brain]
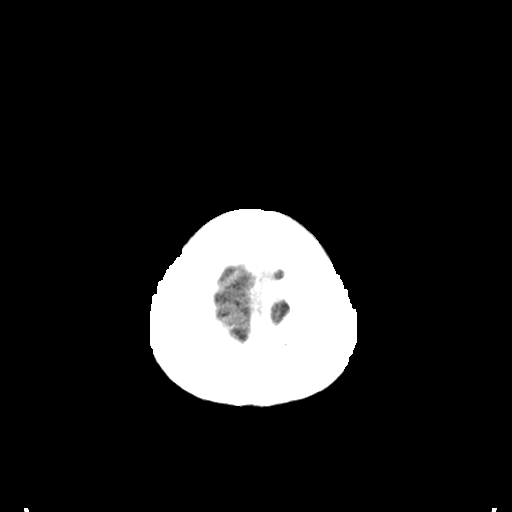
[im 28/30  bone]
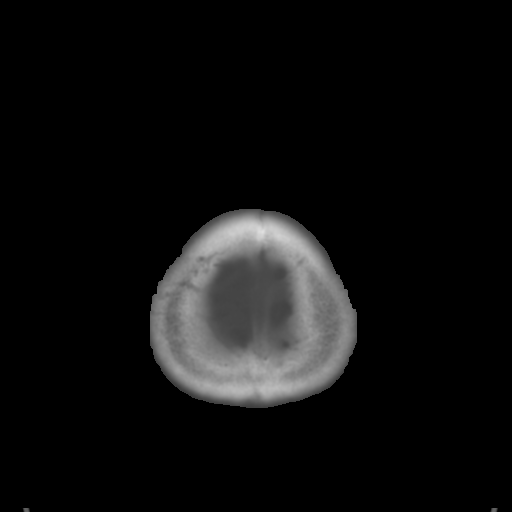

[Series 5: coronal soft tissue · coronal · 0.29mm/px · 3 of 68 slices shown]
[im 23/68  brain]
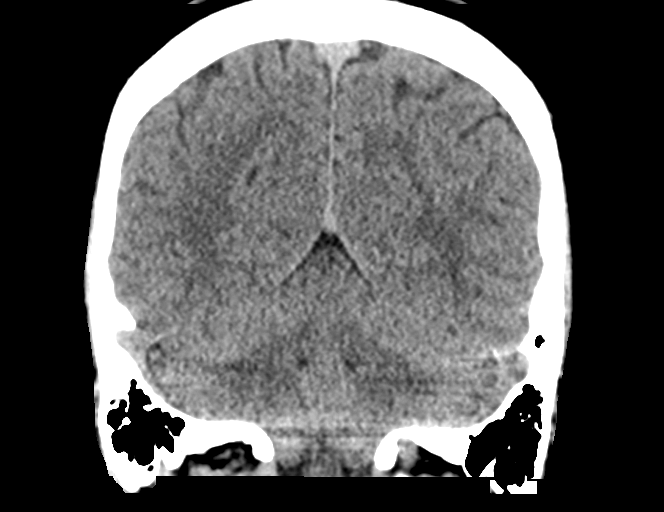
[im 30/68  brain]
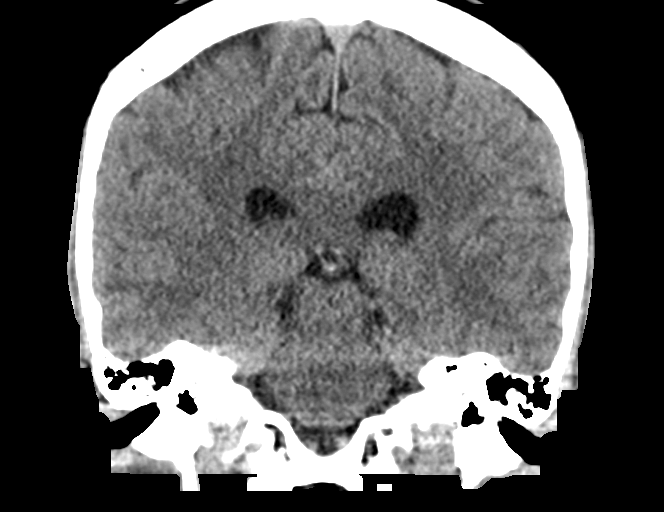
[im 38/68  brain]
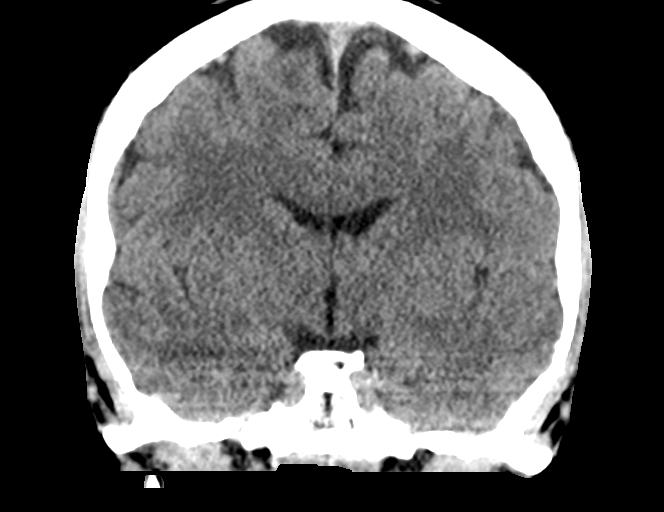

[Series 6: sagittal soft tissue · sagittal · 0.29mm/px · 3 of 65 slices shown]
[im 22/65  brain]
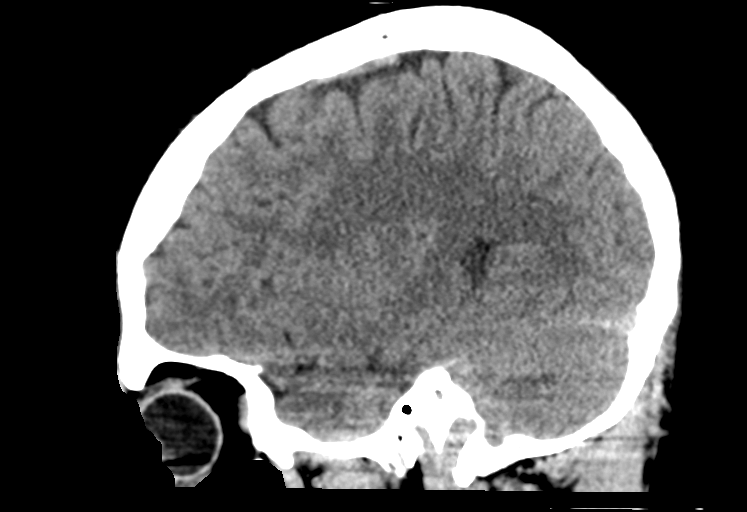
[im 33/65  brain]
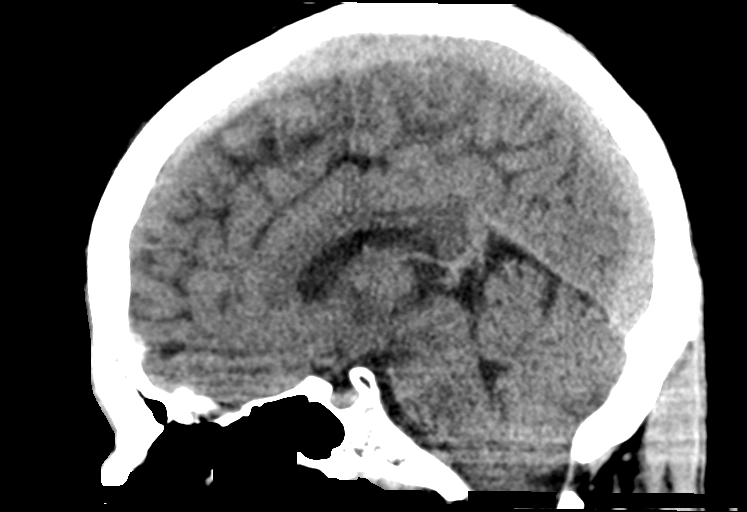
[im 43/65  brain]
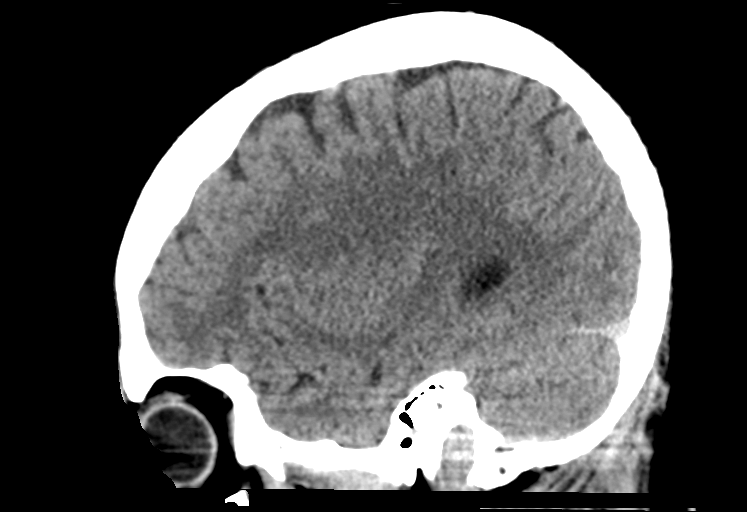

[15 of 47 positions shown; findings below may reference images not displayed]

FINDINGS: Brain: No evidence of acute infarction, hemorrhage, hydrocephalus,
extra-axial collection or mass lesion/mass effect.

Vascular: No hyperdense vessel or unexpected calcification.

Skull: Normal. Negative for fracture or focal lesion.

Sinuses/Orbits: No acute finding.

Other: None.
IMPRESSION: No acute intracranial abnormality.

## 2022-08-22 ENCOUNTER — Ambulatory Visit (INDEPENDENT_AMBULATORY_CARE_PROVIDER_SITE_OTHER): Payer: Medicaid Other | Admitting: Emergency Medicine

## 2022-08-22 ENCOUNTER — Encounter: Payer: Self-pay | Admitting: Emergency Medicine

## 2022-08-22 VITALS — BP 110/72 | HR 73 | Temp 98.4°F | Ht 67.5 in | Wt 206.4 lb

## 2022-08-22 DIAGNOSIS — Z8659 Personal history of other mental and behavioral disorders: Secondary | ICD-10-CM

## 2022-08-22 DIAGNOSIS — Z0000 Encounter for general adult medical examination without abnormal findings: Secondary | ICD-10-CM | POA: Diagnosis not present

## 2022-08-22 DIAGNOSIS — Z130 Encounter for screening for diseases of the blood and blood-forming organs and certain disorders involving the immune mechanism: Secondary | ICD-10-CM

## 2022-08-22 DIAGNOSIS — Z13220 Encounter for screening for lipoid disorders: Secondary | ICD-10-CM | POA: Diagnosis not present

## 2022-08-22 DIAGNOSIS — Z13228 Encounter for screening for other metabolic disorders: Secondary | ICD-10-CM | POA: Diagnosis not present

## 2022-08-22 DIAGNOSIS — Z7689 Persons encountering health services in other specified circumstances: Secondary | ICD-10-CM

## 2022-08-22 DIAGNOSIS — Z1329 Encounter for screening for other suspected endocrine disorder: Secondary | ICD-10-CM | POA: Diagnosis not present

## 2022-08-22 DIAGNOSIS — Z1159 Encounter for screening for other viral diseases: Secondary | ICD-10-CM

## 2022-08-22 DIAGNOSIS — Z23 Encounter for immunization: Secondary | ICD-10-CM | POA: Diagnosis not present

## 2022-08-22 LAB — CBC WITH DIFFERENTIAL/PLATELET
Basophils Absolute: 0.1 10*3/uL (ref 0.0–0.1)
Basophils Relative: 0.7 % (ref 0.0–3.0)
Eosinophils Absolute: 0.1 10*3/uL (ref 0.0–0.7)
Eosinophils Relative: 1.6 % (ref 0.0–5.0)
HCT: 45.5 % (ref 39.0–52.0)
Hemoglobin: 15.2 g/dL (ref 13.0–17.0)
Lymphocytes Relative: 24.4 % (ref 12.0–46.0)
Lymphs Abs: 1.7 10*3/uL (ref 0.7–4.0)
MCHC: 33.3 g/dL (ref 30.0–36.0)
MCV: 88.4 fl (ref 78.0–100.0)
Monocytes Absolute: 0.5 10*3/uL (ref 0.1–1.0)
Monocytes Relative: 7.2 % (ref 3.0–12.0)
Neutro Abs: 4.7 10*3/uL (ref 1.4–7.7)
Neutrophils Relative %: 66.1 % (ref 43.0–77.0)
Platelets: 243 10*3/uL (ref 150.0–400.0)
RBC: 5.14 Mil/uL (ref 4.22–5.81)
RDW: 14.1 % (ref 11.5–15.5)
WBC: 7.1 10*3/uL (ref 4.0–10.5)

## 2022-08-22 LAB — LIPID PANEL
Cholesterol: 176 mg/dL (ref 0–200)
HDL: 51.7 mg/dL (ref >39.00)
LDL Cholesterol: 112 mg/dL — ABNORMAL HIGH (ref 0–99)
NonHDL: 124.16
Total CHOL/HDL Ratio: 3
Triglycerides: 63 mg/dL (ref 0.0–149.0)
VLDL: 12.6 mg/dL (ref 0.0–40.0)

## 2022-08-22 LAB — COMPREHENSIVE METABOLIC PANEL
ALT: 12 U/L (ref 0–53)
AST: 16 U/L (ref 0–37)
Albumin: 4.7 g/dL (ref 3.5–5.2)
Alkaline Phosphatase: 72 U/L (ref 39–117)
BUN: 13 mg/dL (ref 6–23)
CO2: 23 mEq/L (ref 19–32)
Calcium: 9.6 mg/dL (ref 8.4–10.5)
Chloride: 102 mEq/L (ref 96–112)
Creatinine, Ser: 0.89 mg/dL (ref 0.40–1.50)
GFR: 122.51 mL/min (ref >60.00)
Glucose, Bld: 87 mg/dL (ref 70–99)
Potassium: 3.9 mEq/L (ref 3.5–5.1)
Sodium: 135 mEq/L (ref 135–145)
Total Bilirubin: 0.7 mg/dL (ref 0.2–1.2)
Total Protein: 8 g/dL (ref 6.0–8.3)

## 2022-08-22 LAB — HEMOGLOBIN A1C: Hgb A1c MFr Bld: 5.8 % (ref 4.6–6.5)

## 2022-08-22 LAB — TSH: TSH: 0.7 u[IU]/mL (ref 0.35–5.50)

## 2022-08-22 NOTE — Progress Notes (Signed)
Dwayne Gallagher 21 y.o.   Chief Complaint  Patient presents with   New Patient (Initial Visit)    No concerns     HISTORY OF PRESENT ILLNESS: This is a 21 y.o. male first visit to this office, accompanied by mother, here to establish care with me. Has history of bipolar disorder and chronic depression.  Needs psychiatry referral. Used to be on medications but not anymore. No other chronic medical problems.  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 No pertinent surgical history.  Social History   Socioeconomic History   Marital status: Single    Spouse name: Not on file   Number of children: Not on file   Years of education: Not on file   Highest education level: Not on file  Occupational History   Not on file  Tobacco Use   Smoking status: Every Day    Types: E-cigarettes   Smokeless tobacco: Never  Substance and Sexual Activity   Alcohol use: Yes   Drug use: Yes    Types: Marijuana   Sexual activity: Not on file  Other Topics Concern   Not on file  Social History Narrative   Lives with parents and younger brother.   Grandparents from [place].   Social Determinants of Health   Financial Resource Strain: Not on file  Food  Insecurity: Not on file  Transportation Needs: Not on file  Physical Activity: Not on file  Stress: Not on file  Social Connections: Not on file  Intimate Partner Violence: Not on file    Family History  Problem Relation Age of Onset   Asthma Brother    Osteoporosis Maternal Grandmother    Hyperlipidemia Maternal Grandmother      Review of Systems  Constitutional: Negative.  Negative for chills and fever.  HENT: Negative.  Negative for congestion and sore throat.   Respiratory: Negative.  Negative for cough and shortness of breath.   Cardiovascular: Negative.  Negative for chest pain and palpitations.  Gastrointestinal: Negative.  Negative for abdominal pain, blood in stool, nausea and vomiting.  Genitourinary: Negative.   Musculoskeletal: Negative.   Skin: Negative.   Neurological: Negative.  Negative for dizziness and headaches.  All other systems reviewed and are negative.  Today's Vitals   08/22/22 1049  BP: 110/72  Pulse: 73  Temp: 98.4 F (36.9 C)  TempSrc: Oral  SpO2: 98%  Weight: 206 lb 6 oz (93.6 kg)  Height: 5' 7.5" (1.715 m)   Body mass index is 31.85 kg/m.   Physical Exam Vitals reviewed.  Constitutional:      Appearance: Normal appearance.  HENT:     Head: Normocephalic.     Right  Ear: Tympanic membrane, ear canal and external ear normal.     Left Ear: Tympanic membrane, ear canal and external ear normal.     Mouth/Throat:     Mouth: Mucous membranes are moist.     Pharynx: Oropharynx is clear.  Eyes:     Extraocular Movements: Extraocular movements intact.     Conjunctiva/sclera: Conjunctivae normal.     Pupils: Pupils are equal, round, and reactive to light.  Cardiovascular:     Rate and Rhythm: Normal rate and regular rhythm.     Pulses: Normal pulses.     Heart sounds: Normal heart sounds.  Pulmonary:     Effort: Pulmonary effort is normal.     Breath sounds: Normal breath sounds.  Abdominal:     Palpations: Abdomen is soft.      Tenderness: There is no abdominal tenderness.  Musculoskeletal:        General: Normal range of motion.     Cervical back: No tenderness.  Lymphadenopathy:     Cervical: No cervical adenopathy.  Skin:    General: Skin is warm and dry.  Neurological:     General: No focal deficit present.     Mental Status: He is alert and oriented to person, place, and time.  Psychiatric:        Mood and Affect: Mood normal.        Behavior: Behavior normal.      ASSESSMENT & PLAN: Problem List Items Addressed This Visit   None Visit Diagnoses     Routine general medical examination at a health care facility    -  Primary   Relevant Orders   CBC with Differential   Comprehensive metabolic panel   Hemoglobin A1c   Lipid panel   Encounter to establish care       History of bipolar disorder       Relevant Orders   Ambulatory referral to Psychiatry   History of depression       Relevant Orders   Ambulatory referral to Psychiatry   Need for hepatitis C screening test       Relevant Orders   Hepatitis C antibody screen   Screening for deficiency anemia       Relevant Orders   CBC with Differential   Screening for lipoid disorders       Relevant Orders   Lipid panel   Screening for endocrine, metabolic and immunity disorder       Relevant Orders   Comprehensive metabolic panel   Hemoglobin A1c   TSH   Need for vaccination       Relevant Orders   Flu Vaccine QUAD 6+ mos PF IM (Fluarix Quad PF) (Completed)      Modifiable risk factors discussed with patient. Anticipatory guidance according to age provided. The following topics were also discussed: Social Determinants of Health Smoking.  Non-smoker Diet and nutrition and need to decrease amount of daily carbohydrate intake and daily calories and increase amount of plant based protein in his diet Benefits of exercise Cancer family history review Vaccinations reviewed and recommendations Cardiovascular risk assessment and need for  blood work Past history of chronic depression and bipolar disorder and need for psychiatric evaluation Mental health including depression and anxiety Fall and accident prevention  Patient Instructions  Health Maintenance, Male Adopting a healthy lifestyle and getting preventive care are important in promoting health and wellness. Ask your health care provider about: The right schedule for you to have regular tests and  exams. Things you can do on your own to prevent diseases and keep yourself healthy. What should I know about diet, weight, and exercise? Eat a healthy diet  Eat a diet that includes plenty of vegetables, fruits, low-fat dairy products, and lean protein. Do not eat a lot of foods that are high in solid fats, added sugars, or sodium. Maintain a healthy weight Body mass index (BMI) is a measurement that can be used to identify possible weight problems. It estimates body fat based on height and weight. Your health care provider can help determine your BMI and help you achieve or maintain a healthy weight. Get regular exercise Get regular exercise. This is one of the most important things you can do for your health. Most adults should: Exercise for at least 150 minutes each week. The exercise should increase your heart rate and make you sweat (moderate-intensity exercise). Do strengthening exercises at least twice a week. This is in addition to the moderate-intensity exercise. Spend less time sitting. Even light physical activity can be beneficial. Watch cholesterol and blood lipids Have your blood tested for lipids and cholesterol at 21 years of age, then have this test every 5 years. You may need to have your cholesterol levels checked more often if: Your lipid or cholesterol levels are high. You are older than 21 years of age. You are at high risk for heart disease. What should I know about cancer screening? Many types of cancers can be detected early and may often be  prevented. Depending on your health history and family history, you may need to have cancer screening at various ages. This may include screening for: Colorectal cancer. Prostate cancer. Skin cancer. Lung cancer. What should I know about heart disease, diabetes, and high blood pressure? Blood pressure and heart disease High blood pressure causes heart disease and increases the risk of stroke. This is more likely to develop in people who have high blood pressure readings or are overweight. Talk with your health care provider about your target blood pressure readings. Have your blood pressure checked: Every 3-5 years if you are 3-46 years of age. Every year if you are 30 years old or older. If you are between the ages of 71 and 70 and are a current or former smoker, ask your health care provider if you should have a one-time screening for abdominal aortic aneurysm (AAA). Diabetes Have regular diabetes screenings. This checks your fasting blood sugar level. Have the screening done: Once every three years after age 41 if you are at a normal weight and have a low risk for diabetes. More often and at a younger age if you are overweight or have a high risk for diabetes. What should I know about preventing infection? Hepatitis B If you have a higher risk for hepatitis B, you should be screened for this virus. Talk with your health care provider to find out if you are at risk for hepatitis B infection. Hepatitis C Blood testing is recommended for: Everyone born from [birth year] through 1965. Anyone with known risk factors for hepatitis C. Sexually transmitted infections (STIs) You should be screened each year for STIs, including gonorrhea and chlamydia, if: You are sexually active and are younger than 21 years of age. You are older than 21 years of age and your health care provider tells you that you are at risk for this type of infection. Your sexual activity has changed since you were last screened,  and you are at increased risk for chlamydia  or gonorrhea. Ask your health care provider if you are at risk. Ask your health care provider about whether you are at high risk for HIV. Your health care provider may recommend a prescription medicine to help prevent HIV infection. If you choose to take medicine to prevent HIV, you should first get tested for HIV. You should then be tested every 3 months for as long as you are taking the medicine. Follow these instructions at home: Alcohol use Do not drink alcohol if your health care provider tells you not to drink. If you drink alcohol: Limit how much you have to 0-2 drinks a day. Know how much alcohol is in your drink. In the [place] one drink equals one 12 oz bottle of beer (355 mL), one 5 oz glass of wine (148 mL), or one 1 oz glass of hard liquor (44 mL). Lifestyle Do not use any products that contain nicotine or tobacco. These products include cigarettes, chewing tobacco, and vaping devices, such as e-cigarettes. If you need help quitting, ask your health care provider. Do not use street drugs. Do not share needles. Ask your health care provider for help if you need support or information about quitting drugs. General instructions Schedule regular health, dental, and eye exams. Stay current with your vaccines. Tell your health care provider if: You often feel depressed. You have ever been abused or do not feel safe at home. Summary Adopting a healthy lifestyle and getting preventive care are important in promoting health and wellness. Follow your health care provider's instructions about healthy diet, exercising, and getting tested or screened for diseases. Follow your health care provider's instructions on monitoring your cholesterol and blood pressure. This information is not intended to replace advice given to you by your health care provider. Make sure you discuss any questions you have with your health care provider. Document Revised:  03/19/2021 Document Reviewed: 03/19/2021 Elsevier Patient Education  [place], MD [facility]

## 2022-08-22 NOTE — Patient Instructions (Signed)
Health Maintenance, Male Adopting a healthy lifestyle and getting preventive care are important in promoting health and wellness. Ask your health care provider about: The right schedule for you to have regular tests and exams. Things you can do on your own to prevent diseases and keep yourself healthy. What should I know about diet, weight, and exercise? Eat a healthy diet  Eat a diet that includes plenty of vegetables, fruits, low-fat dairy products, and lean protein. Do not eat a lot of foods that are high in solid fats, added sugars, or sodium. Maintain a healthy weight Body mass index (BMI) is a measurement that can be used to identify possible weight problems. It estimates body fat based on height and weight. Your health care provider can help determine your BMI and help you achieve or maintain a healthy weight. Get regular exercise Get regular exercise. This is one of the most important things you can do for your health. Most adults should: Exercise for at least 150 minutes each week. The exercise should increase your heart rate and make you sweat (moderate-intensity exercise). Do strengthening exercises at least twice a week. This is in addition to the moderate-intensity exercise. Spend less time sitting. Even light physical activity can be beneficial. Watch cholesterol and blood lipids Have your blood tested for lipids and cholesterol at 20 years of age, then have this test every 5 years. You may need to have your cholesterol levels checked more often if: Your lipid or cholesterol levels are high. You are older than 21 years of age. You are at high risk for heart disease. What should I know about cancer screening? Many types of cancers can be detected early and may often be prevented. Depending on your health history and family history, you may need to have cancer screening at various ages. This may include screening for: Colorectal cancer. Prostate cancer. Skin cancer. Lung  cancer. What should I know about heart disease, diabetes, and high blood pressure? Blood pressure and heart disease High blood pressure causes heart disease and increases the risk of stroke. This is more likely to develop in people who have high blood pressure readings or are overweight. Talk with your health care provider about your target blood pressure readings. Have your blood pressure checked: Every 3-5 years if you are 18-39 years of age. Every year if you are 40 years old or older. If you are between the ages of 65 and 75 and are a current or former smoker, ask your health care provider if you should have a one-time screening for abdominal aortic aneurysm (AAA). Diabetes Have regular diabetes screenings. This checks your fasting blood sugar level. Have the screening done: Once every three years after age 45 if you are at a normal weight and have a low risk for diabetes. More often and at a younger age if you are overweight or have a high risk for diabetes. What should I know about preventing infection? Hepatitis B If you have a higher risk for hepatitis B, you should be screened for this virus. Talk with your health care provider to find out if you are at risk for hepatitis B infection. Hepatitis C Blood testing is recommended for: Everyone born from 1945 through 1965. Anyone with known risk factors for hepatitis C. Sexually transmitted infections (STIs) You should be screened each year for STIs, including gonorrhea and chlamydia, if: You are sexually active and are younger than 21 years of age. You are older than 21 years of age and your   health care provider tells you that you are at risk for this type of infection. Your sexual activity has changed since you were last screened, and you are at increased risk for chlamydia or gonorrhea. Ask your health care provider if you are at risk. Ask your health care provider about whether you are at high risk for HIV. Your health care provider  may recommend a prescription medicine to help prevent HIV infection. If you choose to take medicine to prevent HIV, you should first get tested for HIV. You should then be tested every 3 months for as long as you are taking the medicine. Follow these instructions at home: Alcohol use Do not drink alcohol if your health care provider tells you not to drink. If you drink alcohol: Limit how much you have to 0-2 drinks a day. Know how much alcohol is in your drink. In the [place] one drink equals one 12 oz bottle of beer (355 mL), one 5 oz glass of wine (148 mL), or one 1 oz glass of hard liquor (44 mL). Lifestyle Do not use any products that contain nicotine or tobacco. These products include cigarettes, chewing tobacco, and vaping devices, such as e-cigarettes. If you need help quitting, ask your health care provider. Do not use street drugs. Do not share needles. Ask your health care provider for help if you need support or information about quitting drugs. General instructions Schedule regular health, dental, and eye exams. Stay current with your vaccines. Tell your health care provider if: You often feel depressed. You have ever been abused or do not feel safe at home. Summary Adopting a healthy lifestyle and getting preventive care are important in promoting health and wellness. Follow your health care provider's instructions about healthy diet, exercising, and getting tested or screened for diseases. Follow your health care provider's instructions on monitoring your cholesterol and blood pressure. This information is not intended to replace advice given to you by your health care provider. Make sure you discuss any questions you have with your health care provider. Document Revised: 03/19/2021 Document Reviewed: 03/19/2021 [employer] Patient Education  2023 [employer]  

## 2022-08-23 LAB — HEPATITIS C ANTIBODY: Hepatitis C Ab: NONREACTIVE

## 2024-07-27 ENCOUNTER — Encounter: Payer: MEDICAID | Admitting: Emergency Medicine
